# Patient Record
Sex: Male | Born: 1965 | Race: White | Hispanic: No | Marital: Married | Smoking: Never smoker
Health system: Southern US, Community
[De-identification: ages and names within clinical notes are randomized; demographics above are authoritative.]

## PROBLEM LIST (undated history)

## (undated) DIAGNOSIS — Z9889 Other specified postprocedural states: Secondary | ICD-10-CM

## (undated) DIAGNOSIS — C801 Malignant (primary) neoplasm, unspecified: Secondary | ICD-10-CM

## (undated) DIAGNOSIS — T7840XA Allergy, unspecified, initial encounter: Secondary | ICD-10-CM

## (undated) DIAGNOSIS — B019 Varicella without complication: Secondary | ICD-10-CM

## (undated) DIAGNOSIS — T8859XA Other complications of anesthesia, initial encounter: Secondary | ICD-10-CM

## (undated) DIAGNOSIS — N200 Calculus of kidney: Secondary | ICD-10-CM

## (undated) DIAGNOSIS — E785 Hyperlipidemia, unspecified: Secondary | ICD-10-CM

## (undated) DIAGNOSIS — Z87442 Personal history of urinary calculi: Secondary | ICD-10-CM

## (undated) DIAGNOSIS — M1990 Unspecified osteoarthritis, unspecified site: Secondary | ICD-10-CM

## (undated) DIAGNOSIS — J302 Other seasonal allergic rhinitis: Secondary | ICD-10-CM

## (undated) DIAGNOSIS — K759 Inflammatory liver disease, unspecified: Secondary | ICD-10-CM

## (undated) HISTORY — PX: SHOULDER ARTHROSCOPY: SHX128

## (undated) HISTORY — DX: Calculus of kidney: N20.0

## (undated) HISTORY — DX: Other seasonal allergic rhinitis: J30.2

## (undated) HISTORY — DX: Varicella without complication: B01.9

## (undated) HISTORY — DX: Hyperlipidemia, unspecified: E78.5

## (undated) HISTORY — PX: ULNAR NERVE DECOMPRESSION: SHX7404

## (undated) HISTORY — DX: Unspecified osteoarthritis, unspecified site: M19.90

## (undated) HISTORY — DX: Inflammatory liver disease, unspecified: K75.9

## (undated) HISTORY — DX: Allergy, unspecified, initial encounter: T78.40XA

## (undated) HISTORY — DX: Gilbert syndrome: E80.4

## (undated) HISTORY — PX: COLONOSCOPY: SHX174

## (undated) HISTORY — PX: WISDOM TOOTH EXTRACTION: SHX21

---

## 1998-09-18 ENCOUNTER — Ambulatory Visit (HOSPITAL_BASED_OUTPATIENT_CLINIC_OR_DEPARTMENT_OTHER): Admission: RE | Admit: 1998-09-18 | Discharge: 1998-09-18 | Payer: Self-pay | Admitting: Orthopaedic Surgery

## 2009-12-24 ENCOUNTER — Encounter: Admission: RE | Admit: 2009-12-24 | Discharge: 2009-12-24 | Payer: Self-pay | Admitting: Family Medicine

## 2016-12-23 ENCOUNTER — Ambulatory Visit (INDEPENDENT_AMBULATORY_CARE_PROVIDER_SITE_OTHER): Payer: BC Managed Care – PPO | Admitting: Family Medicine

## 2016-12-23 ENCOUNTER — Encounter: Payer: Self-pay | Admitting: Family Medicine

## 2016-12-23 VITALS — BP 102/72 | HR 83 | Temp 98.2°F | Ht 75.0 in | Wt 240.6 lb

## 2016-12-23 DIAGNOSIS — M1990 Unspecified osteoarthritis, unspecified site: Secondary | ICD-10-CM | POA: Insufficient documentation

## 2016-12-23 DIAGNOSIS — Z0000 Encounter for general adult medical examination without abnormal findings: Secondary | ICD-10-CM | POA: Diagnosis not present

## 2016-12-23 DIAGNOSIS — E785 Hyperlipidemia, unspecified: Secondary | ICD-10-CM

## 2016-12-23 DIAGNOSIS — R739 Hyperglycemia, unspecified: Secondary | ICD-10-CM | POA: Insufficient documentation

## 2016-12-23 DIAGNOSIS — N200 Calculus of kidney: Secondary | ICD-10-CM | POA: Insufficient documentation

## 2016-12-23 LAB — COMPREHENSIVE METABOLIC PANEL
ALT: 25 U/L (ref 0–53)
AST: 18 U/L (ref 0–37)
Albumin: 4.6 g/dL (ref 3.5–5.2)
Alkaline Phosphatase: 71 U/L (ref 39–117)
BILIRUBIN TOTAL: 1.4 mg/dL — AB (ref 0.2–1.2)
BUN: 16 mg/dL (ref 6–23)
CO2: 31 meq/L (ref 19–32)
Calcium: 9.6 mg/dL (ref 8.4–10.5)
Chloride: 106 mEq/L (ref 96–112)
Creatinine, Ser: 0.97 mg/dL (ref 0.40–1.50)
GFR: 87.38 mL/min (ref >60.00)
GLUCOSE: 88 mg/dL (ref 70–99)
Potassium: 4.5 mEq/L (ref 3.5–5.1)
Sodium: 141 mEq/L (ref 135–145)
Total Protein: 7 g/dL (ref 6.0–8.3)

## 2016-12-23 LAB — CBC
HCT: 47.4 % (ref 39.0–52.0)
HEMOGLOBIN: 16.3 g/dL (ref 13.0–17.0)
MCHC: 34.5 g/dL (ref 30.0–36.0)
MCV: 88.8 fl (ref 78.0–100.0)
Platelets: 192 10*3/uL (ref 150.0–400.0)
RBC: 5.33 Mil/uL (ref 4.22–5.81)
RDW: 13.2 % (ref 11.5–15.5)
WBC: 6.8 10*3/uL (ref 4.0–10.5)

## 2016-12-23 LAB — LIPID PANEL
CHOL/HDL RATIO: 4
Cholesterol: 207 mg/dL — ABNORMAL HIGH (ref 0–200)
HDL: 49.3 mg/dL (ref >39.00)
LDL Cholesterol: 141 mg/dL — ABNORMAL HIGH (ref 0–99)
NONHDL: 157.74
Triglycerides: 83 mg/dL (ref 0.0–149.0)
VLDL: 16.6 mg/dL (ref 0.0–40.0)

## 2016-12-23 LAB — HEMOGLOBIN A1C: Hgb A1c MFr Bld: 5.3 % (ref 4.6–6.5)

## 2016-12-23 NOTE — Patient Instructions (Addendum)
Will await records since you have already requested or you can send me last Tdap/tetanus shot through [facility] before you leave  Would advise 150 minutes exercise, consider stopping eating after 8. I like your goal of 15-20 lbs down in a years time.

## 2016-12-23 NOTE — Progress Notes (Signed)
Phone: [phone]  Subjective:  Patient presents today to establish care. Prior patient of Dr. Tollie Pizza. Chief complaint-noted.   See problem oriented charting  The following were reviewed and entered/updated in epic: Past Medical History:  Diagnosis Date  . Arthritis    during scans for kidney stones. [facility]- hip bilateral  . Chicken pox   . Hepatitis    junior in [place]. Jaundice mild. was told no further workup needed. ? Hep A. self resolved  . Hyperlipidemia    no rx  . Kidney stone    as needed with urology. 2019 next planned visit.    Patient Active Problem List   Diagnosis Date Noted  . Kidney stone   . Hyperlipidemia   . Arthritis    Past Surgical History:  Procedure Laterality Date  . SHOULDER ARTHROSCOPY Right    rotator cuff. Dr. Latanya Maudlin [facility]    Family History  Problem Relation Age of Onset  . Other Mother     not very open about her health  . Atrial fibrillation Father     has had some vertiog  . Healthy Brother     has had some vertigo, mild anxiety  . Arthritis Maternal Grandmother   . Colon cancer Maternal Grandmother     late life   . Hyperlipidemia Paternal Grandmother   . Heart disease Paternal Grandmother     17s  . Stroke Paternal Grandmother   . Hypertension Paternal Grandmother   . Prostate cancer Paternal Grandfather     lived near a plant  . CAD Maternal Grandfather     "hardening of arteries" "smoked like chimneys"    Medications- reviewed and updated No current outpatient prescriptions on file.   No current facility-administered medications for this visit.     Allergies-reviewed and updated No Known Allergies  Social History   Social History  . Marital status: Single    Spouse name: N/A  . Number of children: N/A  . Years of education: N/A   Social History Main Topics  . Smoking status: Never Smoker  . Smokeless tobacco: Never Used  . Alcohol use Yes     Comment: Social 2 beers a month  . Drug use: No    . Sexual activity: Yes   Other Topics Concern  . None   Social History Narrative   Married 1998. 2 daughters ('03, '06). Hs and middle school in 2018.       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    Undergrad- [employer]   Masters- [employer]      Hobbies: play tennis, hiking, working on Immunologist to play guitar    ROS--Full ROS was completed Review of Systems  Constitutional: Negative for chills and fever.  HENT: Negative for hearing loss and tinnitus.   Eyes: Negative for blurred vision and double vision.  Respiratory: Negative for cough and shortness of breath.   Cardiovascular: Negative for chest pain and palpitations.  Gastrointestinal: Negative for heartburn and nausea.  Genitourinary: Negative for dysuria and urgency.  Musculoskeletal: Positive for joint pain (hip pain at times). Negative for myalgias and neck pain.  Skin: Negative for itching and rash.  Neurological: Negative for dizziness and headaches.  Endo/Heme/Allergies: Negative for polydipsia. Does not bruise/bleed easily.  Psychiatric/Behavioral: Negative for hallucinations and substance abuse.   Objective: BP 102/72 (BP Location: Left Arm, Patient Position: Sitting, Cuff Size: Large)   Pulse 83   Temp 98.2 F (36.8 C) (Oral)  Ht 6\' 3"  (1.905 m)   Wt 240 lb 9.6 oz (109.1 kg)   SpO2 95%   BMI 30.07 kg/m  Gen: NAD, resting comfortably HEENT: Mucous membranes are moist. Oropharynx normal. TM normal. Eyes: sclera and lids normal, PERRLA Neck: no thyromegaly, no cervical lymphadenopathy CV: RRR no murmurs rubs or gallops Lungs: CTAB no crackles, wheeze, rhonchi Abdomen: soft/nontender/nondistended/normal bowel sounds. No rebound or guarding. obese Ext: no edema, 2+ DP pulses Skin: warm, dry Neuro: 5/5 strength in upper and lower extremities, normal gait, normal reflexes GU deferred and rectal deferred to next year  Assessment/Plan:  51 y.o. male presenting for annual  physical.  Health Maintenance counseling: 1. Anticipatory guidance: Patient counseled regarding regular dental exams - q6 months, eye exams -yearly Dr. Satira Sark, wearing seatbelts.  2. Risk factor reduction:  Advised patient of need for regular exercise and diet rich and fruits and vegetables to reduce risk of heart attack and stroke. Exercise- averages about once a week 1.5 hours tennis, walks some on campus. Advised 150 minutes a week. Diet-thinks largely reasonable- sweets are a trouble spot when he compares to his weight. States if he could stop eating 8 o clock at night could be 15 lbs lighter.  Wt Readings from Last 3 Encounters:  12/23/16 240 lb 9.6 oz (109.1 kg)   3. Immunizations/screenings/ancillary studies Immunization History  Administered Date(s) Administered  . Influenza-Unspecified 09/10/2016   Health Maintenance Due  Topic Date Due  . HIV Screening - before marriage had std testing as part of general physical 10/29/1982  . TETANUS/TDAP - around 2017, to get records 10/29/1986   4. Prostate cancer screening- no early family history (mid to late 14s), start at age 2 5. Colon cancer screening - no early  family history (in 66s), start at age 89 6. Skin cancer screening- Dr. Ronnald Ramp [facility] yearly 7. Sleep 6.5 hours- advised 7-8. Gets up to go to restroom and cannot get back to sleep. Feels rested.   Status of chronic or acute concerns  Hyperlipidemia- no rx- calculate 10 year risk  Hyperglycemia- told in past based off sugar- also get a1c- fasting today  Return in about 1 year (around 12/23/2017) for physical.  Orders Placed This Encounter  Procedures  . CBC    [place]  . Comprehensive metabolic panel    [place]    Order Specific Question:   Has the patient fasted?    Answer:   No  . Lipid panel    [place]    Order Specific Question:   Has the patient fasted?    Answer:   No  . Hemoglobin A1c    [place]   Return precautions advised.   Garret Reddish, MD

## 2016-12-23 NOTE — Progress Notes (Signed)
Pre visit review using our clinic review tool, if applicable. No additional management support is needed unless otherwise documented below in the visit note. 

## 2016-12-24 ENCOUNTER — Encounter: Payer: Self-pay | Admitting: Family Medicine

## 2017-10-13 ENCOUNTER — Ambulatory Visit: Payer: BC Managed Care – PPO | Admitting: Podiatry

## 2017-10-13 ENCOUNTER — Ambulatory Visit (INDEPENDENT_AMBULATORY_CARE_PROVIDER_SITE_OTHER): Payer: BC Managed Care – PPO

## 2017-10-13 ENCOUNTER — Encounter: Payer: Self-pay | Admitting: Podiatry

## 2017-10-13 DIAGNOSIS — M779 Enthesopathy, unspecified: Principal | ICD-10-CM

## 2017-10-13 DIAGNOSIS — M778 Other enthesopathies, not elsewhere classified: Secondary | ICD-10-CM

## 2017-10-13 DIAGNOSIS — M7750 Other enthesopathy of unspecified foot: Secondary | ICD-10-CM

## 2017-10-13 DIAGNOSIS — M722 Plantar fascial fibromatosis: Secondary | ICD-10-CM

## 2017-10-13 DIAGNOSIS — R252 Cramp and spasm: Secondary | ICD-10-CM

## 2017-10-13 NOTE — Progress Notes (Signed)
  Subjective:  Patient ID: Jonathan Knox, male    DOB: Apr 16, 1966,  MRN: [id number] HPI Chief Complaint  Patient presents with  . Foot Pain    Plantar forefoot left - callused area x few months, seen 2 dermatologist and treated - no help, been using salycylic acid at home-tender  . Foot Pain    Arch left - patient states he gets cramping in arch periodically    51 y.o. male presents with the above complaint.     Past Medical History:  Diagnosis Date  . Arthritis    during scans for kidney stones. [facility]- hip bilateral  . Chicken pox   . Hepatitis    junior in [place]. Jaundice mild. was told no further workup needed. ? Hep A. self resolved  . Hyperlipidemia    no rx  . Kidney stone    as needed with urology. 2019 next planned visit.    Past Surgical History:  Procedure Laterality Date  . SHOULDER ARTHROSCOPY Right    rotator cuff. Dr. Latanya Maudlin [facility]   No current outpatient medications on file.  No Known Allergies Review of Systems  All other systems reviewed and are negative.  Objective:  There were no vitals filed for this visit.  General: Well developed, nourished, in no acute distress, alert and oriented x3   Dermatological: Skin is warm, dry and supple bilateral. Nails x 10 are well maintained; remaining integument appears unremarkable at this time. There are no open sores, no preulcerative lesions, no rash or signs of infection present. Porokeratosis left foot.  Vascular: Dorsalis Pedis artery and Posterior Tibial artery pedal pulses are 2/4 bilateral with immedate capillary fill time. Pedal hair growth present. No varicosities and no lower extremity edema present bilateral.   Neruologic: Grossly intact via light touch bilateral. Vibratory intact via tuning fork bilateral. Protective threshold with Semmes Wienstein monofilament intact to all pedal sites bilateral. Patellar and Achilles deep tendon reflexes 2+ bilateral. No Babinski or clonus noted  bilateral.   Musculoskeletal: No gross boney pedal deformities bilateral. No pain, crepitus, or limitation noted with foot and ankle range of motion bilateral. Muscular strength 5/5 in all groups tested bilateral.  Gait: Unassisted, Nonantalgic.    Radiographs:  Radiographs taken today do not demonstrate any type of major osseous abnormalities though doesn't straighten midtarsal breech.  Assessment & Plan:   Assessment: Midtarsal breech with a history of myofascial pain and cramps. Porokeratosis plantar aspect forefoot left.  Plan: We discussed getting him a sports orthotic to help reduce the stress on the plantar fascia.     Jonathan Knox T. [place], [place]

## 2017-10-26 ENCOUNTER — Ambulatory Visit (INDEPENDENT_AMBULATORY_CARE_PROVIDER_SITE_OTHER): Payer: BC Managed Care – PPO | Admitting: Orthotics

## 2017-10-26 DIAGNOSIS — M722 Plantar fascial fibromatosis: Secondary | ICD-10-CM

## 2017-10-26 DIAGNOSIS — M7750 Other enthesopathy of unspecified foot: Secondary | ICD-10-CM

## 2017-10-26 DIAGNOSIS — M779 Enthesopathy, unspecified: Secondary | ICD-10-CM

## 2017-10-26 DIAGNOSIS — M778 Other enthesopathies, not elsewhere classified: Secondary | ICD-10-CM

## 2017-10-26 NOTE — Progress Notes (Signed)
Patient came into today to be cast for Custom Foot Orthotics. Upon recommendation of Dr. Milinda Pointer  Patient presents with p/f left as well a keratoma left distal to 5th met head Goals are long arch support, forefoot cushioning, offload keratoma Plan vendor [employer]

## 2017-11-26 ENCOUNTER — Ambulatory Visit (INDEPENDENT_AMBULATORY_CARE_PROVIDER_SITE_OTHER): Payer: BC Managed Care – PPO | Admitting: Orthotics

## 2017-11-26 DIAGNOSIS — M722 Plantar fascial fibromatosis: Secondary | ICD-10-CM | POA: Diagnosis not present

## 2017-11-26 NOTE — Progress Notes (Signed)
Patient came in today to pick up custom made foot orthotics.  The goals were accomplished and the patient reported no dissatisfaction with said orthotics.  Patient was advised of breakin period and how to report any issues. 

## 2017-12-29 ENCOUNTER — Encounter: Payer: Self-pay | Admitting: Internal Medicine

## 2017-12-29 ENCOUNTER — Encounter: Payer: Self-pay | Admitting: Family Medicine

## 2017-12-29 ENCOUNTER — Ambulatory Visit (INDEPENDENT_AMBULATORY_CARE_PROVIDER_SITE_OTHER): Payer: BC Managed Care – PPO | Admitting: Family Medicine

## 2017-12-29 VITALS — BP 98/70 | HR 73 | Temp 98.0°F | Ht 75.0 in | Wt 227.4 lb

## 2017-12-29 DIAGNOSIS — Z1211 Encounter for screening for malignant neoplasm of colon: Secondary | ICD-10-CM

## 2017-12-29 DIAGNOSIS — E785 Hyperlipidemia, unspecified: Secondary | ICD-10-CM

## 2017-12-29 DIAGNOSIS — Z0000 Encounter for general adult medical examination without abnormal findings: Secondary | ICD-10-CM | POA: Diagnosis not present

## 2017-12-29 DIAGNOSIS — Z125 Encounter for screening for malignant neoplasm of prostate: Secondary | ICD-10-CM

## 2017-12-29 LAB — COMPREHENSIVE METABOLIC PANEL
ALT: 20 U/L (ref 0–53)
AST: 18 U/L (ref 0–37)
Albumin: 4.5 g/dL (ref 3.5–5.2)
Alkaline Phosphatase: 78 U/L (ref 39–117)
BUN: 13 mg/dL (ref 6–23)
CALCIUM: 9.5 mg/dL (ref 8.4–10.5)
CHLORIDE: 102 meq/L (ref 96–112)
CO2: 31 mEq/L (ref 19–32)
Creatinine, Ser: 0.9 mg/dL (ref 0.40–1.50)
GFR: 94.87 mL/min (ref >60.00)
Glucose, Bld: 95 mg/dL (ref 70–99)
POTASSIUM: 4.5 meq/L (ref 3.5–5.1)
Sodium: 139 mEq/L (ref 135–145)
TOTAL PROTEIN: 7.5 g/dL (ref 6.0–8.3)
Total Bilirubin: 2 mg/dL — ABNORMAL HIGH (ref 0.2–1.2)

## 2017-12-29 LAB — LIPID PANEL
Cholesterol: 198 mg/dL (ref 0–200)
HDL: 54.2 mg/dL (ref >39.00)
LDL CALC: 127 mg/dL — AB (ref 0–99)
NonHDL: 144.23
Total CHOL/HDL Ratio: 4
Triglycerides: 84 mg/dL (ref 0.0–149.0)
VLDL: 16.8 mg/dL (ref 0.0–40.0)

## 2017-12-29 LAB — CBC
HCT: 48.9 % (ref 39.0–52.0)
HEMOGLOBIN: 16.8 g/dL (ref 13.0–17.0)
MCHC: 34.3 g/dL (ref 30.0–36.0)
MCV: 89.2 fl (ref 78.0–100.0)
PLATELETS: 185 10*3/uL (ref 150.0–400.0)
RBC: 5.48 Mil/uL (ref 4.22–5.81)
RDW: 13.4 % (ref 11.5–15.5)
WBC: 6.8 10*3/uL (ref 4.0–10.5)

## 2017-12-29 NOTE — Assessment & Plan Note (Signed)
Hyperlipidemia- mild elevations. usuing last years lipids- 10 year risk is 2.3% using last years labs  (last year calculation was 2.3%). Update lipids today- hopeful improved with weiht loss Lab Results  Component Value Date   CHOL 207 (H) 12/23/2016   HDL 49.30 12/23/2016   LDLCALC 141 (H) 12/23/2016   TRIG 83.0 12/23/2016   CHOLHDL 4 12/23/2016

## 2017-12-29 NOTE — Patient Instructions (Signed)
Please stop by lab before you go  [place] job on weight loss- I love your goal of 15 lbs more off over the next year or two

## 2017-12-29 NOTE — Progress Notes (Signed)
Phone: [phone]  Subjective:  Patient presents today for their annual physical. Chief complaint-noted.   See problem oriented charting- ROS- full  review of systems was completed and negative except for: seasonal allergies  The following were reviewed and entered/updated in epic: Past Medical History:  Diagnosis Date  . Arthritis    during scans for kidney stones. [facility]- hip bilateral  . Chicken pox   . Hepatitis    junior in [place]. Jaundice mild. was told no further workup needed. ? Hep A. self resolved  . Hyperlipidemia    no rx  . Kidney stone    as needed with urology. 2019 next planned visit.    Patient Active Problem List   Diagnosis Date Noted  . Hyperglycemia 12/23/2016    Priority: Medium  . Hyperlipidemia     Priority: Medium  . Kidney stone     Priority: Low  . Arthritis     Priority: Low  . 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 SHOULDER ARTHROSCOPY Right    rotator cuff. Dr. Latanya Maudlin [facility]    Family History  Problem Relation Age of Onset  . Other Mother        not very open about her health  . Atrial fibrillation Father        has had some vertiog  . Healthy Brother        has had some vertigo, mild anxiety  . Arthritis Maternal Grandmother   . Colon cancer Maternal Grandmother        late life   . Hyperlipidemia Paternal Grandmother   . Heart disease Paternal Grandmother        25s  . Stroke Paternal Grandmother   . Hypertension Paternal Grandmother   . Prostate cancer Paternal Grandfather        lived near a plant  . CAD Maternal Grandfather        "hardening of arteries" "smoked like chimneys"    Medications- reviewed and updated No current outpatient medications on file.   No current facility-administered medications for this visit.     Allergies-reviewed and updated No Known Allergies  Social History   Social History Narrative   Married 1998. 2 daughters ('03, '06).  Hs and middle school in 2018.       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    Undergrad- [employer]   Masters- [employer]      Hobbies: play tennis, hiking, working on Immunologist to play guitar    Objective: BP 98/70 (BP Location: Left Arm, Patient Position: Sitting, Cuff Size: Large)   Pulse 73   Temp 98 F (36.7 C) (Oral)   Ht 6\' 3"  (1.905 m)   Wt 227 lb 6.4 oz (103.1 kg)   SpO2 97%   BMI 28.42 kg/m  Gen: NAD, resting comfortably HEENT: Mucous membranes are moist. Oropharynx normal Neck: no thyromegaly CV: RRR no murmurs rubs or gallops Lungs: CTAB no crackles, wheeze, rhonchi Abdomen: soft/nontender/nondistended/normal bowel sounds. No rebound or guarding.  Ext: no edema Skin: warm, dry Neuro: grossly normal, moves all extremities, PERRLA  Declines rectal- will do with urology  Assessment/Plan:  52 y.o. male presenting for annual physical.  Health Maintenance counseling: 1. Anticipatory guidance: Patient counseled regarding regular dental exams - q6 months, eye exams - sees Dr. Satira Sark yearly, wearing seatbelts.  2. Risk factor reduction:  Advised patient of need for regular exercise and diet rich and  fruits and vegetables to reduce risk of heart attack and stroke. Last year he wanted to cut down on eating after 8 pm as well as working on cutting odwn sweets. Last year was doing 1.5 hours a week of tennis. He is down 13 lbs- found an app and started counting calories- cut this down and seemed to help. Keeping tennis up as well.  He would like to get another 15 lbs off in next year or two Wt Readings from Last 3 Encounters:  12/29/17 227 lb 6.4 oz (103.1 kg)  12/23/16 240 lb 9.6 oz (109.1 kg)  3. Immunizations/screenings/ancillary studies- shingrix availability issues.  Immunization History  Administered Date(s) Administered  . Influenza-Unspecified 09/10/2016, 09/02/2017  . Td 12/05/2015  . Tdap 03/09/2006  4. Prostate cancer screening-  no  early family history (grandfather with prostate cancer but lived near plant- discussed starting today vs at 65 per USPTF guidelines. He has a march appointment with urology for kidney stone follow up and will do PSA/rectal exam. They will also do UA.  5. Colon cancer screening - refer for colonoscopy today 6. Skin cancer screening- Dr. Ronnald Ramp [facility] yearly. advised regular sunscreen use. Denies worrisome, changing, or new skin lesions. Saw them late September.   Status of chronic or acute concerns   Hyperglycemia- has been told in past based off CBGs but a1c looked great last year Lab Results  Component Value Date   HGBA1C 5.3 12/23/2016   Continues see Dr. Orvil Feil for allergies- twice a month allergy shots. Has done well with that.   Hyperlipidemia Hyperlipidemia- mild elevations. usuing last years lipids- 10 year risk is 2.3% using last years labs  (last year calculation was 2.3%). Update lipids today- hopeful improved with weiht loss Lab Results  Component Value Date   CHOL 207 (H) 12/23/2016   HDL 49.30 12/23/2016   LDLCALC 141 (H) 12/23/2016   TRIG 83.0 12/23/2016   CHOLHDL 4 12/23/2016     Gilberts syndrome High bilirubin- will trend bilirubin today with LFTs. Likely gilberts  1 year CPE  Lab/Order associations: Hyperlipidemia, unspecified hyperlipidemia type - Plan: CBC, Comprehensive metabolic panel, Lipid panel  Screening PSA (prostate specific antigen) - Plan: PSA  Screen for colon cancer - Plan: Ambulatory referral to Gastroenterology  Return precautions advised.  Garret Reddish, MD

## 2017-12-29 NOTE — Assessment & Plan Note (Signed)
High bilirubin- will trend bilirubin today with LFTs. Likely gilberts

## 2018-02-15 ENCOUNTER — Ambulatory Visit (AMBULATORY_SURGERY_CENTER): Payer: Self-pay | Admitting: *Deleted

## 2018-02-15 ENCOUNTER — Other Ambulatory Visit: Payer: Self-pay

## 2018-02-15 VITALS — Ht 74.0 in | Wt 231.2 lb

## 2018-02-15 DIAGNOSIS — Z1211 Encounter for screening for malignant neoplasm of colon: Secondary | ICD-10-CM

## 2018-02-15 MED ORDER — SUPREP BOWEL PREP KIT 17.5-3.13-1.6 GM/177ML PO SOLN: 1.0000 | Freq: Once | ORAL | 0 refills | Status: AC

## 2018-02-15 NOTE — Progress Notes (Signed)
Patient denies any allergies to egg or soy products. Patient states nausea with General anesthesia.  Patient denies oxygen use at home and denies diet medications. Pamphlet given for colonoscopy.

## 2018-02-16 ENCOUNTER — Encounter: Payer: Self-pay | Admitting: Internal Medicine

## 2018-03-01 ENCOUNTER — Other Ambulatory Visit: Payer: Self-pay

## 2018-03-01 ENCOUNTER — Encounter: Payer: Self-pay | Admitting: Internal Medicine

## 2018-03-01 ENCOUNTER — Ambulatory Visit (AMBULATORY_SURGERY_CENTER): Payer: BC Managed Care – PPO | Admitting: Internal Medicine

## 2018-03-01 VITALS — BP 119/70 | HR 69 | Temp 99.1°F | Resp 9 | Ht 74.0 in | Wt 231.0 lb

## 2018-03-01 DIAGNOSIS — Z1211 Encounter for screening for malignant neoplasm of colon: Secondary | ICD-10-CM | POA: Diagnosis present

## 2018-03-01 DIAGNOSIS — D122 Benign neoplasm of ascending colon: Secondary | ICD-10-CM | POA: Diagnosis not present

## 2018-03-01 MED ORDER — SODIUM CHLORIDE 0.9 % IV SOLN: 500.0000 mL | Freq: Once | INTRAVENOUS | Status: DC

## 2018-03-01 NOTE — Progress Notes (Signed)
Called to room to assist during endoscopic procedure.  Patient ID and intended procedure confirmed with present staff. Received instructions for my participation in the procedure from the performing physician.  

## 2018-03-01 NOTE — Op Note (Signed)
[facility] Patient Name: Jonathan Knox Procedure Date: 03/01/2018 11:50 AM MRN: [id number] Endoscopist: Docia Chuck. Henrene Pastor , MD Age: 53 Referring MD:  Date of Birth: [birth year] Gender: Male Account #: [id number] Procedure:                Colonoscopy, with cold snare polypectomy x 1 Indications:              Screening for colorectal malignant neoplasm Medicines:                Monitored Anesthesia Care Procedure:                Pre-Anesthesia Assessment:                           - Prior to the procedure, a History and Physical                            was performed, and patient medications and                            allergies were reviewed. The patient's tolerance of                            previous anesthesia was also reviewed. The risks                            and benefits of the procedure and the sedation                            options and risks were discussed with the patient.                            All questions were answered, and informed consent                            was obtained. Prior Anticoagulants: The patient has                            taken no previous anticoagulant or antiplatelet                            agents. ASA Grade Assessment: I - A normal, healthy                            patient. After reviewing the risks and benefits,                            the patient was deemed in satisfactory condition to                            undergo the procedure.                           After obtaining informed consent, the colonoscope  was passed under direct vision. Throughout the                            procedure, the patient's blood pressure, pulse, and                            oxygen saturations were monitored continuously. The                            Colonoscope was introduced through the anus and                            advanced to the the cecum, identified by   appendiceal orifice and ileocecal valve. The                            ileocecal valve, appendiceal orifice, and rectum                            were photographed. The quality of the bowel                            preparation was good. The colonoscopy was performed                            without difficulty. The patient tolerated the                            procedure well. The bowel preparation used was                            SUPREP. Scope In: 11:52:41 AM Scope Out: 12:09:21 PM Scope Withdrawal Time: 0 hours 13 minutes 54 seconds  Total Procedure Duration: 0 hours 16 minutes 40 seconds  Findings:                 A 2 mm polyp was found in the ascending colon. The                            polyp was removed with a cold snare. Resection and                            retrieval were complete.                           A single diverticulum was found in the ascending                            colon.                           Internal hemorrhoids were found during                            retroflexion. The hemorrhoids were small.  The exam was otherwise without abnormality on                            direct and retroflexion views. Complications:            No immediate complications. Estimated blood loss:                            None. Estimated Blood Loss:     Estimated blood loss: none. Impression:               - One 2 mm polyp in the ascending colon, removed                            with a cold snare. Resected and retrieved.                           - Diverticulosis in the ascending colon.                           - Internal hemorrhoids.                           - The examination was otherwise normal on direct                            and retroflexion views. Recommendation:           - Repeat colonoscopy in 5-10 years for surveillance.                           - Patient has a contact number available for                             emergencies. The signs and symptoms of potential                            delayed complications were discussed with the                            patient. Return to normal activities tomorrow.                            Written discharge instructions were provided to the                            patient.                           - Resume previous diet.                           - Continue present medications.                           - Await pathology results. Docia Chuck. Henrene Pastor, MD 03/01/2018 12:14:38 PM This report has been signed electronically.

## 2018-03-01 NOTE — Progress Notes (Signed)
Pt. Reports no change in his medical or surgical history since his pre-visit 02/15/2018.

## 2018-03-01 NOTE — Patient Instructions (Signed)
HANDOUTS GIVEN FOR POLYPS, DIVERTICULOSIS AND HEMORRHOIDS  YOU HAD AN ENDOSCOPIC PROCEDURE TODAY AT THE [facility]:   Refer to the procedure report that was given to you for any specific questions about what was found during the examination.  If the procedure report does not answer your questions, please call your gastroenterologist to clarify.  If you requested that your care partner not be given the details of your procedure findings, then the procedure report has been included in a sealed envelope for you to review at your convenience later.  YOU SHOULD EXPECT: Some feelings of bloating in the abdomen. Passage of more gas than usual.  Walking can help get rid of the air that was put into your GI tract during the procedure and reduce the bloating. If you had a lower endoscopy (such as a colonoscopy or flexible sigmoidoscopy) you may notice spotting of blood in your stool or on the toilet paper. If you underwent a bowel prep for your procedure, you may not have a normal bowel movement for a few days.  Please Note:  You might notice some irritation and congestion in your nose or some drainage.  This is from the oxygen used during your procedure.  There is no need for concern and it should clear up in a day or so.  SYMPTOMS TO REPORT IMMEDIATELY:   Following lower endoscopy (colonoscopy or flexible sigmoidoscopy):  Excessive amounts of blood in the stool  Significant tenderness or worsening of abdominal pains  Swelling of the abdomen that is new, acute  Fever of 100F or higher  For urgent or emergent issues, a gastroenterologist can be reached at any hour by calling [phone].   DIET:  We do recommend a small meal at first, but then you may proceed to your regular diet.  Drink plenty of fluids but you should avoid alcoholic beverages for 24 hours.  ACTIVITY:  You should plan to take it easy for the rest of today and you should NOT DRIVE or use heavy machinery until tomorrow  (because of the sedation medicines used during the test).    FOLLOW UP: Our staff will call the number listed on your records the next business day following your procedure to check on you and address any questions or concerns that you may have regarding the information given to you following your procedure. If we do not reach you, we will leave a message.  However, if you are feeling well and you are not experiencing any problems, there is no need to return our call.  We will assume that you have returned to your regular daily activities without incident.  If any biopsies were taken you will be contacted by phone or by letter within the next 1-3 weeks.  Please call us at [phone] if you have not heard about the biopsies in 3 weeks.    SIGNATURES/CONFIDENTIALITY: You and/or your care partner have signed paperwork which will be entered into your electronic medical record.  These signatures attest to the fact that that the information above on your After Visit Summary has been reviewed and is understood.  Full responsibility of the confidentiality of this discharge information lies with you and/or your care-partner. 

## 2018-03-01 NOTE — Progress Notes (Signed)
Report to PACU, RN, vss, BBS= Clear.  

## 2018-03-02 ENCOUNTER — Telehealth: Payer: Self-pay

## 2018-03-02 NOTE — Telephone Encounter (Signed)
  Follow up Call-  Call back number 03/01/2018  Post procedure Call Back phone  # [phone]  Permission to leave phone message Yes  Some recent data might be hidden     Patient questions:  Do you have a fever, pain , or abdominal swelling? No. Pain Score  0 *  Have you tolerated food without any problems? Yes.    Have you been able to return to your normal activities? Yes.    Do you have any questions about your discharge instructions: Diet   No. Medications  No. Follow up visit  No.  Do you have questions or concerns about your Care? No.  Actions: * If pain score is 4 or above: No action needed, pain <4.  Per Pt, "everything is a ok".  maw

## 2018-03-05 ENCOUNTER — Encounter: Payer: Self-pay | Admitting: Internal Medicine

## 2018-03-06 ENCOUNTER — Encounter: Payer: Self-pay | Admitting: Family Medicine

## 2018-03-06 DIAGNOSIS — Z86010 Personal history of colonic polyps: Secondary | ICD-10-CM | POA: Insufficient documentation

## 2018-03-06 DIAGNOSIS — Z860101 Personal history of adenomatous and serrated colon polyps: Secondary | ICD-10-CM | POA: Insufficient documentation

## 2018-11-10 ENCOUNTER — Other Ambulatory Visit: Payer: Self-pay | Admitting: Orthopedic Surgery

## 2018-11-10 DIAGNOSIS — M533 Sacrococcygeal disorders, not elsewhere classified: Secondary | ICD-10-CM

## 2018-11-23 ENCOUNTER — Ambulatory Visit
Admission: RE | Admit: 2018-11-23 | Discharge: 2018-11-23 | Disposition: A | Discharge status: Home / Self Care | Payer: BC Managed Care – PPO | Source: Ambulatory Visit | Attending: Orthopedic Surgery | Admitting: Orthopedic Surgery

## 2018-11-23 DIAGNOSIS — M533 Sacrococcygeal disorders, not elsewhere classified: Secondary | ICD-10-CM

## 2018-11-23 MED ORDER — METHYLPREDNISOLONE ACETATE 40 MG/ML INJ SUSP (RADIOLOG: 120.0000 mg | Freq: Once | INTRAMUSCULAR | Status: DC

## 2018-12-30 ENCOUNTER — Ambulatory Visit (INDEPENDENT_AMBULATORY_CARE_PROVIDER_SITE_OTHER): Payer: BC Managed Care – PPO | Admitting: Family Medicine

## 2018-12-30 ENCOUNTER — Encounter: Payer: Self-pay | Admitting: Family Medicine

## 2018-12-30 VITALS — BP 100/60 | HR 85 | Temp 98.0°F | Ht 74.0 in | Wt 244.0 lb

## 2018-12-30 DIAGNOSIS — E785 Hyperlipidemia, unspecified: Secondary | ICD-10-CM

## 2018-12-30 DIAGNOSIS — R5383 Other fatigue: Secondary | ICD-10-CM | POA: Diagnosis not present

## 2018-12-30 DIAGNOSIS — R739 Hyperglycemia, unspecified: Secondary | ICD-10-CM | POA: Diagnosis not present

## 2018-12-30 DIAGNOSIS — Z125 Encounter for screening for malignant neoplasm of prostate: Secondary | ICD-10-CM

## 2018-12-30 DIAGNOSIS — Z86010 Personal history of colonic polyps: Secondary | ICD-10-CM

## 2018-12-30 DIAGNOSIS — Z6831 Body mass index (BMI) 31.0-31.9, adult: Secondary | ICD-10-CM

## 2018-12-30 DIAGNOSIS — Z0000 Encounter for general adult medical examination without abnormal findings: Secondary | ICD-10-CM | POA: Diagnosis not present

## 2018-12-30 DIAGNOSIS — R6882 Decreased libido: Secondary | ICD-10-CM

## 2018-12-30 DIAGNOSIS — Z23 Encounter for immunization: Secondary | ICD-10-CM | POA: Diagnosis not present

## 2018-12-30 LAB — LIPID PANEL
Cholesterol: 208 mg/dL — ABNORMAL HIGH (ref 0–200)
HDL: 50.1 mg/dL (ref >39.00)
LDL Cholesterol: 142 mg/dL — ABNORMAL HIGH (ref 0–99)
NonHDL: 157.67
Total CHOL/HDL Ratio: 4
Triglycerides: 79 mg/dL (ref 0.0–149.0)
VLDL: 15.8 mg/dL (ref 0.0–40.0)

## 2018-12-30 LAB — COMPREHENSIVE METABOLIC PANEL
ALT: 24 U/L (ref 0–53)
AST: 13 U/L (ref 0–37)
Albumin: 4.6 g/dL (ref 3.5–5.2)
Alkaline Phosphatase: 74 U/L (ref 39–117)
BUN: 17 mg/dL (ref 6–23)
CO2: 30 mEq/L (ref 19–32)
Calcium: 9.3 mg/dL (ref 8.4–10.5)
Chloride: 102 mEq/L (ref 96–112)
Creatinine, Ser: 0.93 mg/dL (ref 0.40–1.50)
GFR: 85.6 mL/min (ref >60.00)
GLUCOSE: 89 mg/dL (ref 70–99)
Potassium: 4.1 mEq/L (ref 3.5–5.1)
Sodium: 140 mEq/L (ref 135–145)
Total Bilirubin: 1.6 mg/dL — ABNORMAL HIGH (ref 0.2–1.2)
Total Protein: 7 g/dL (ref 6.0–8.3)

## 2018-12-30 LAB — TSH: TSH: 1.88 u[IU]/mL (ref 0.35–4.50)

## 2018-12-30 LAB — CBC
HCT: 47.9 % (ref 39.0–52.0)
Hemoglobin: 16.7 g/dL (ref 13.0–17.0)
MCHC: 34.9 g/dL (ref 30.0–36.0)
MCV: 90.8 fl (ref 78.0–100.0)
Platelets: 185 10*3/uL (ref 150.0–400.0)
RBC: 5.28 Mil/uL (ref 4.22–5.81)
RDW: 13.7 % (ref 11.5–15.5)
WBC: 7 10*3/uL (ref 4.0–10.5)

## 2018-12-30 LAB — HEMOGLOBIN A1C: HEMOGLOBIN A1C: 5.3 % (ref 4.6–6.5)

## 2018-12-30 NOTE — Patient Instructions (Addendum)
Health Maintenance Due  Topic Date Due  . INFLUENZA VACCINE - Done at work 08/31/2018   Shingrix #1 today. Repeat injection in 2-5 months. 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  If you have mychart- we will send your results within 3 business days of [place] receiving them.  If abnormal or we want to clarify a result, we will call or mychart you to make sure you receive the message.  If you have questions or concerns or don't hear within 5-7 days, please send [place] a message or call us.

## 2018-12-30 NOTE — Progress Notes (Signed)
Phone: [phone]   Subjective:  Patient presents today for their annual physical. Chief complaint-noted.   See problem oriented charting- ROS- full  review of systems was completed and negative except for: urinary frequency, muscle aches, seasonal allergies  BMI monitoring- elevated BMI noted: Body mass index is 31.33 kg/m. Encouraged need for healthy eating, regular exercise, weight loss.   BMI Metric Follow Up - 12/30/18 0819      BMI Metric Follow Up-Please document annually   BMI Metric Follow Up  Education provided      The following were reviewed and entered/updated in epic: Past Medical History:  Diagnosis Date  . Arthritis    [facility]- hip bilateral  . Chicken pox   . Gilbert's syndrome   . Hepatitis    junior in [place]. Jaundice mild. was told no further workup needed. ? Hep A. self resolved  . Hyperlipidemia    no med-diet controlled  . Kidney stone    x 2 - passed stone, no surgery required  . Seasonal allergies    Patient Active Problem List   Diagnosis Date Noted  . History of adenomatous polyp of colon 03/06/2018    Priority: Medium  . Hyperglycemia 12/23/2016    Priority: Medium  . Hyperlipidemia     Priority: Medium  . Gilberts syndrome 12/24/2016    Priority: Low  . Kidney stone     Priority: Low  . Arthritis     Priority: Low   Past Surgical History:  Procedure Laterality Date  . SHOULDER ARTHROSCOPY Right    rotator cuff. Dr. Latanya Maudlin [facility]  . WISDOM TOOTH EXTRACTION      Family History  Problem Relation Age of Onset  . Other Mother        not very open about her health  . Atrial fibrillation Father        has had some vertiog  . Healthy Brother        has had some vertigo, mild anxiety  . Arthritis Maternal Grandmother   . Colon cancer Maternal Grandmother        late life   . Hyperlipidemia Paternal Grandmother   . Heart disease Paternal Grandmother        78s  . Stroke Paternal Grandmother   . Hypertension Paternal  Grandmother   . Prostate cancer Paternal Grandfather        lived near a plant  . CAD Maternal Grandfather        "hardening of arteries" "smoked like chimneys"  . Rectal cancer Neg Hx   . Stomach cancer Neg Hx     Medications- reviewed and updated Current Outpatient Medications  Medication Sig Dispense Refill  . ibuprofen (ADVIL,MOTRIN) 200 MG tablet Take 400 mg by mouth every 6 (six) hours as needed for fever or moderate pain.    Marland Kitchen UNABLE TO FIND Med Name: Allergy Shots 3-4 per month at [facility].    Marland Kitchen EPINEPHrine 0.3 mg/0.3 mL IJ SOAJ injection     . HYDROcodone-acetaminophen (NORCO/VICODIN) 5-325 MG tablet Patient states he has this on hand if needed     No current facility-administered medications for this visit.     Allergies-reviewed and updated No Known Allergies  Social History   Social History Narrative   Married 1998. 2 daughters ('03, '06). Hs and middle school in 2018.       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    [employer]   Masters-  [employer]      Hobbies: play tennis, hiking, working on learning to play guitar   Objective  Objective:  BP 100/60 (BP Location: Right Arm, Patient Position: Sitting, Cuff Size: Large)   Pulse 85   Temp 98 F (36.7 C) (Oral)   Ht 6\' 2"  (1.88 m)   Wt 244 lb (110.7 kg)   SpO2 97%   BMI 31.33 kg/m  Gen: NAD, resting comfortably HEENT: Mucous membranes are moist. Oropharynx normal Neck: no thyromegaly CV: RRR no murmurs rubs or gallops Lungs: CTAB no crackles, wheeze, rhonchi Abdomen: soft/nontender/nondistended/normal bowel sounds. No rebound or guarding.  Ext: no edema Skin: warm, dry Neuro: grossly normal, moves all extremities, PERRLA   Assessment and Plan  53 y.o. male presenting for annual physical.  Health Maintenance counseling: 1. Anticipatory guidance: Patient counseled regarding regular dental exams -q6 months, eye exams -yearly with Dr. Satira Sark,  avoiding  smoking and second hand smoke , limiting alcohol to 2 beverages per day - less than 1.   2. Risk factor reduction:  Advised patient of need for regular exercise and diet rich and fruits and vegetables to reduce risk of heart attack and stroke. Exercise-last year was playing tennis at least an hour and a half a week- per week gets 30 minutes. . Diet-last year found success with a calorie counting- using NOOM app on phone- feels does well between 7 AM and 8 PM- stress response is to eat at nighttime.  Patient was at 227 last physical down from 240 in 2018-weight swung back up- his goal is to get down to low 230s here or 229 on home scale with longer term goal 208 or so.  Wt Readings from Last 3 Encounters:  12/30/18 244 lb (110.7 kg)  03/01/18 231 lb (104.8 kg)  02/15/18 231 lb 3.2 oz (104.9 kg)  3. Immunizations/screenings/ancillary studies-flu shot at work.  Shingrix shot discussed and will do today  Immunization History  Administered Date(s) Administered  . Influenza-Unspecified 09/10/2016, 09/02/2017  . Td 12/05/2015  . Tdap 03/09/2006  4. Prostate cancer screening- patient with a grandfather with prostate cancer but he lives near a plant and it was thought to be related to that.  Last year we discussed starting prostate cancer screening versus deferring at 56-he wanted to discuss with urology at his visit in March 2019- saw Dr. Gilford Rile and had low PSA at around 0.2.  Will do another round in 6 weeks  5. Colon cancer screening - 03/01/2018 with 5-year follow-up due to adenomatous colon polyp history 6. Skin cancer screening-sees Dr. Ronnald Ramp of [facility] dermatology yearly.  Advised regular sunscreen use. Denies worrisome, changing, or new skin lesions.   7.  Never smoker  Status of chronic or acute concerns   Follows with Dr. Remus Blake for allergies-twice a month shots  Low energy/low libido- wants to check testosterone. Fasting between 8-9 AM.  - wants tsh checked as well  Hyperglycemia-based  off prior CBGs but A1c looks great in 2018-we will repeat with recent weight gain Lab Results  Component Value Date   HGBA1C 5.3 12/23/2016    Hyperlipidemia-mild elevations but ten-year risk is been under 5%.  We will update lipids today  Gilbert's syndrome- high bilirubin per baseline-we will trend  No problem-specific Assessment & Plan notes found for this encounter.  No follow-ups on file.  Lab/Order associations:Fasting. Preventative health care - Plan: CBC, Comprehensive metabolic panel, Lipid panel, Testos,Total,Free and SHBG (Male)  Hyperlipidemia, unspecified hyperlipidemia type - Plan: CBC, Comprehensive metabolic  panel, Lipid panel  Gilberts syndrome - Plan: Comprehensive metabolic panel  History of adenomatous polyp of colon  Hyperglycemia - Plan: Hemoglobin A1c  Screening for prostate cancer - Plan: CANCELED: Urinalysis, CANCELED: PSA  Need for shingles vaccine - Plan: Varicella-zoster vaccine IM  BMI 31.0-31.9,adult  Low libido - Plan: Testos,Total,Free and SHBG (Male)  Fatigue, unspecified type - Plan: TSH  Return precautions advised.  Garret Reddish, MD

## 2019-01-02 LAB — TESTOS,TOTAL,FREE AND SHBG (FEMALE)
Free Testosterone: 58.8 pg/mL (ref 35.0–155.0)
Sex Hormone Binding: 32 nmol/L (ref 10–50)
Testosterone, Total, LC-MS-MS: 411 ng/dL (ref 250–1100)

## 2019-01-07 ENCOUNTER — Telehealth: Payer: Self-pay

## 2019-01-07 NOTE — Telephone Encounter (Signed)
   Copied from [place] [id number]. Topic: General - Other >> Jan 07, 2019 11:29 AM Jonathan Knox R wrote: Patient is calling in to schedule 2nd Shingrix vaccine   Pt scheduled! No further action needed.

## 2019-04-12 ENCOUNTER — Other Ambulatory Visit: Payer: Self-pay

## 2019-04-12 ENCOUNTER — Ambulatory Visit (INDEPENDENT_AMBULATORY_CARE_PROVIDER_SITE_OTHER): Payer: BC Managed Care – PPO | Admitting: Family Medicine

## 2019-04-12 ENCOUNTER — Ambulatory Visit: Payer: BC Managed Care – PPO

## 2019-04-12 DIAGNOSIS — Z23 Encounter for immunization: Secondary | ICD-10-CM

## 2019-04-12 NOTE — Patient Instructions (Signed)
There are no preventive care reminders to display for this patient.  Depression screen [facility] 2/9 12/30/2018 12/29/2017  Decreased Interest 0 0  Down, Depressed, Hopeless 0 0  PHQ - 2 Score 0 0

## 2019-04-12 NOTE — Progress Notes (Signed)
Immunization History  Administered Date(s) Administered  . Influenza-Unspecified 09/10/2016, 09/02/2017, 08/31/2018  . Td 12/05/2015  . Tdap 03/09/2006  . Zoster Recombinat (Shingrix) 12/30/2018, 04/12/2019   Shingrix given- staff to document  Garret Reddish

## 2019-07-05 ENCOUNTER — Other Ambulatory Visit: Payer: Self-pay

## 2019-07-05 DIAGNOSIS — Z20822 Contact with and (suspected) exposure to covid-19: Secondary | ICD-10-CM

## 2019-07-06 ENCOUNTER — Telehealth: Payer: Self-pay

## 2019-07-06 LAB — NOVEL CORONAVIRUS, NAA: SARS-CoV-2, NAA: NOT DETECTED

## 2019-07-06 IMAGING — CT CT BIOPSY
4 of 8 series · 12 of 32 positions shown, 17 images · non-contrast
Comparison: none

CLINICAL DATA: Right sacroiliac pain.

[Series 4: needle -guided injection · axial · 0.82mm/px · z∈[-134,-34]mm · 6 of 70 slices shown, 11 images (1 of 4)]
[im 10/70  soft-tissue]
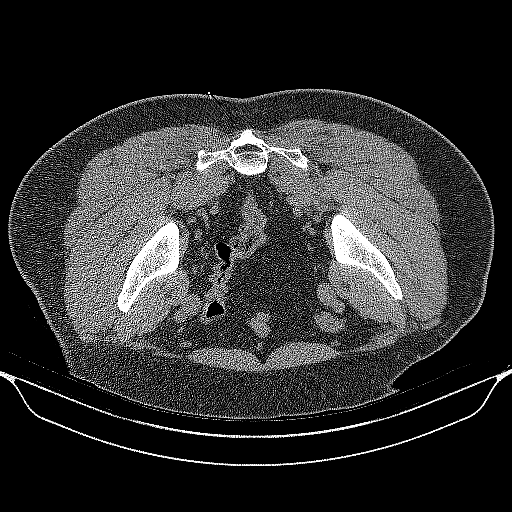
[im 10/70  bone]
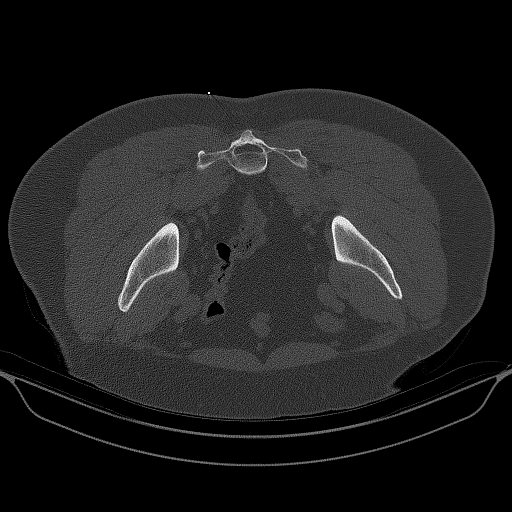
[im 20/70  soft-tissue]
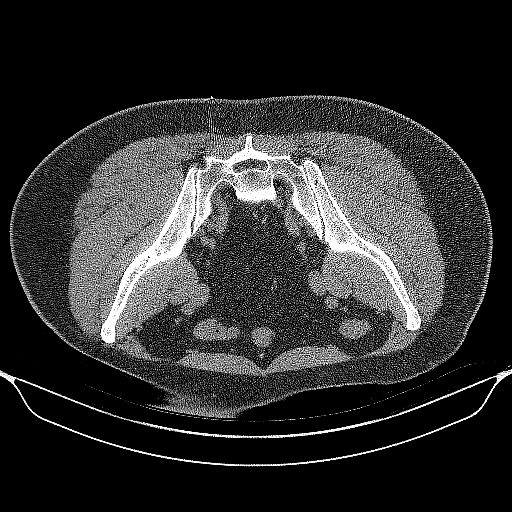
[im 30/70  soft-tissue]
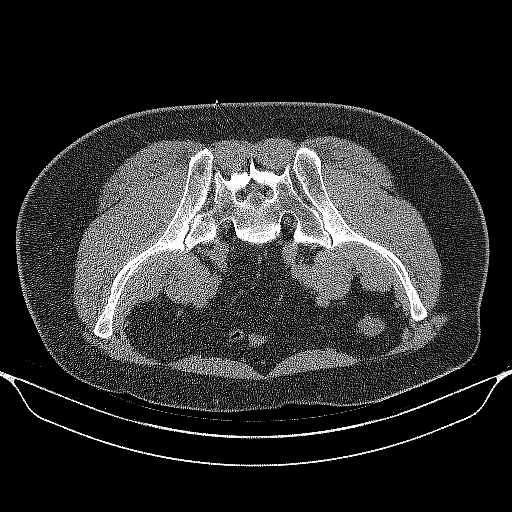
[im 30/70  lung]
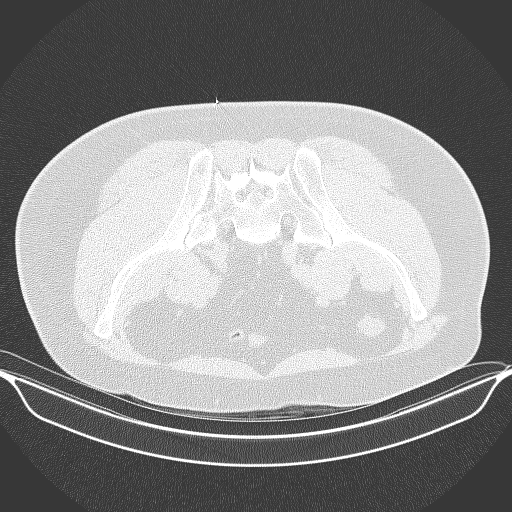
[im 40/70  soft-tissue]
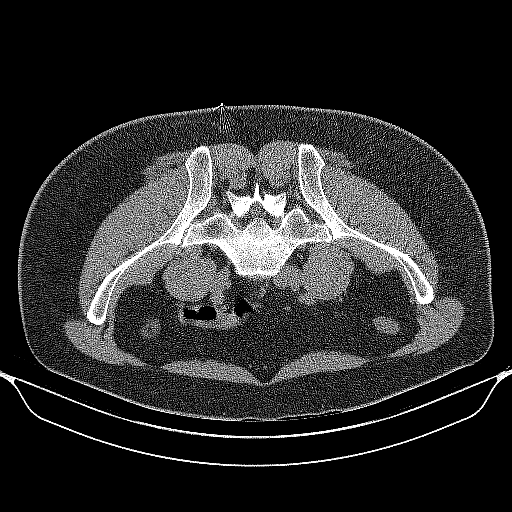
[im 40/70  lung]
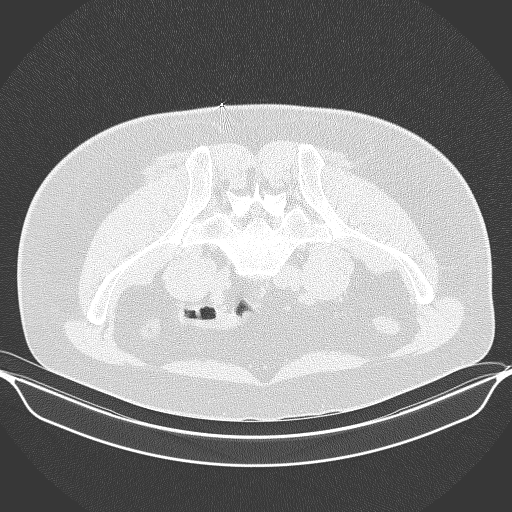
[im 50/70  soft-tissue]
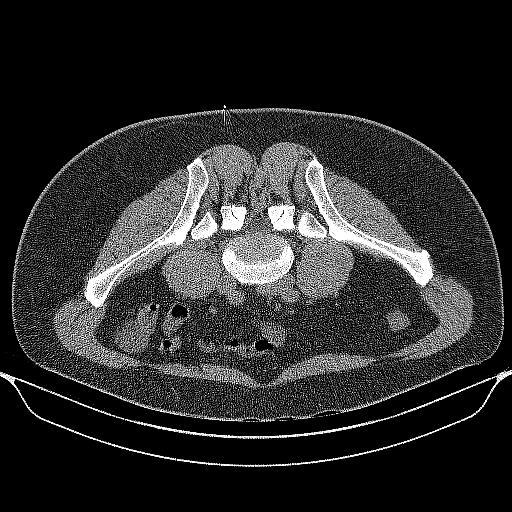
[im 50/70  lung]
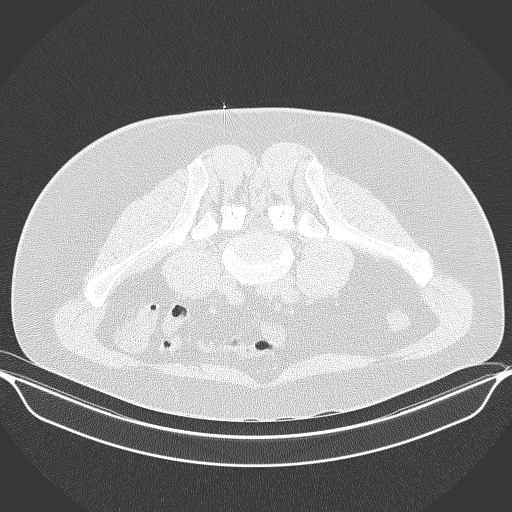
[im 60/70  soft-tissue]
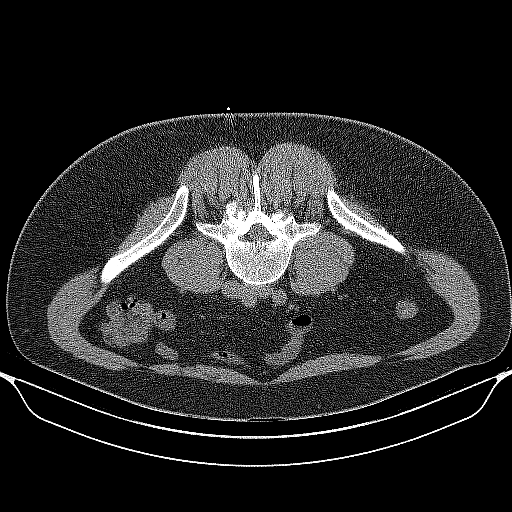
[im 60/70  lung]
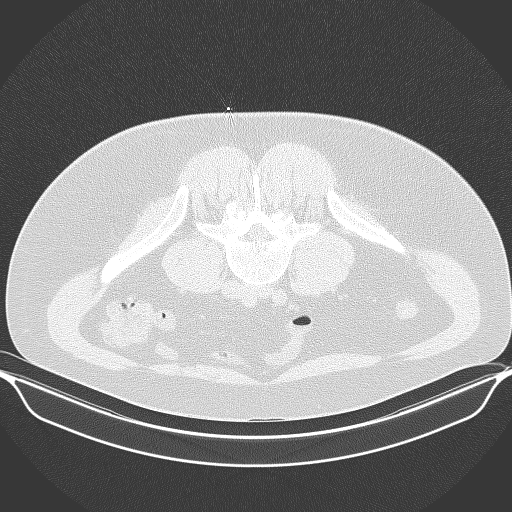

[Series 10: needle -guided injection · axial · 0.78mm/px · z∈[-127,-103]mm · 2 of 36 slices shown (2 of 4)]
[im 12/36  soft-tissue]
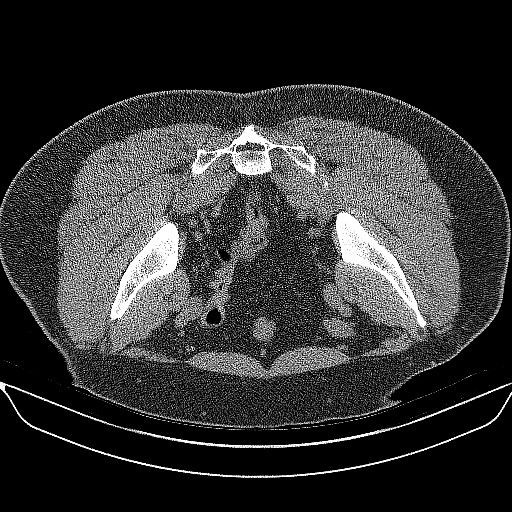
[im 24/36  soft-tissue]
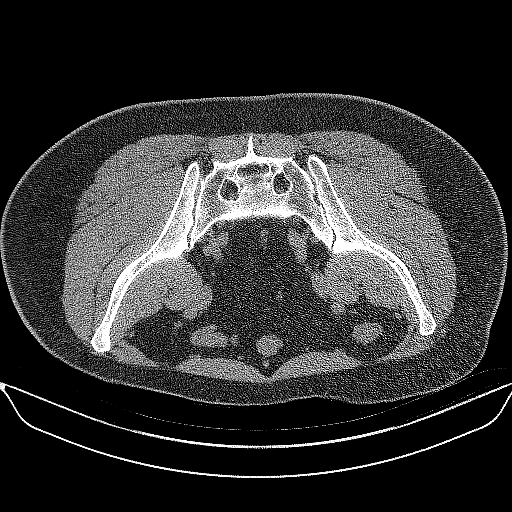

[Series 11: needle -guided injection · axial · 0.78mm/px · z∈[-127,-103]mm · 2 of 36 slices shown (3 of 4)]
[im 12/36  soft-tissue]
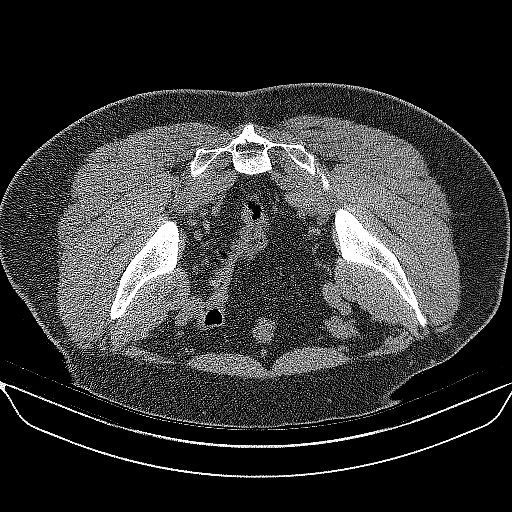
[im 24/36  soft-tissue]
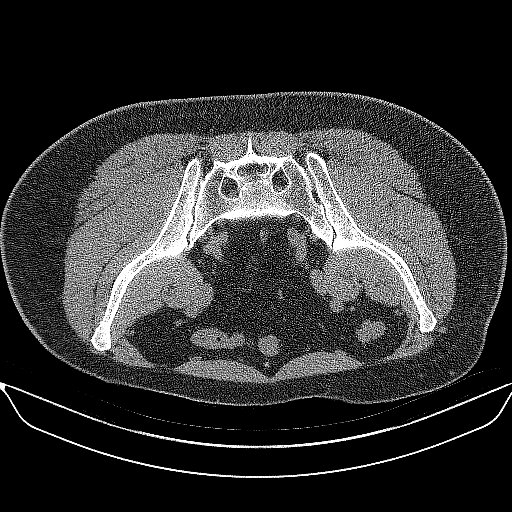

[Series 12: needle -guided injection · axial · 0.78mm/px · z∈[-127,-103]mm · 2 of 36 slices shown (4 of 4)]
[im 12/36  soft-tissue]
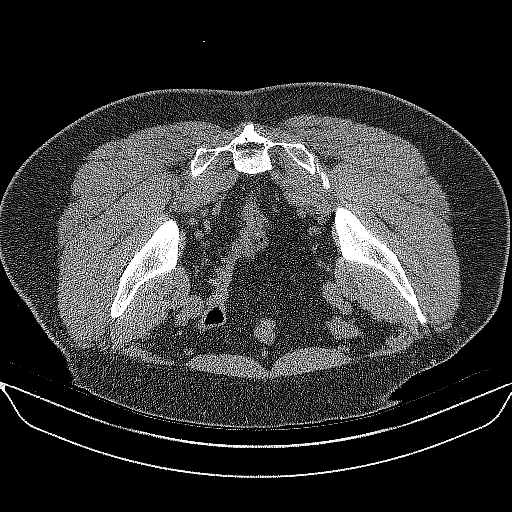
[im 24/36  soft-tissue]
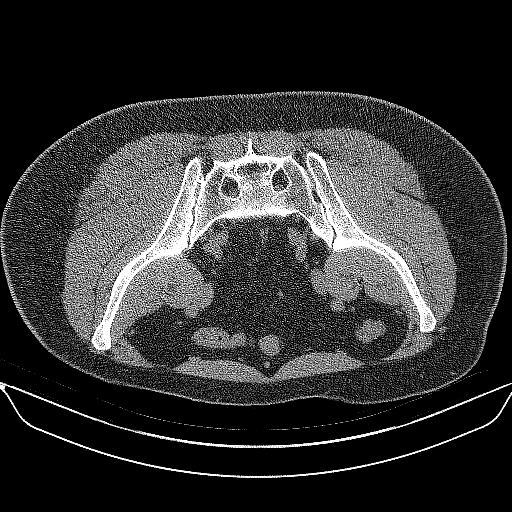

[12 of 32 positions shown; findings below may reference images not displayed]

EXAM:
CT GUIDED RIGHT SI JOINT INJECTION



After local anesthesia with 1% lidocaine without epinephrine and
subsequent deep anesthesia, a 3.5 inch 22 gauge spinal needle was
advanced into the right SI joint under intermittent CT guidance.

Injection of 0.5 mL of Isovue M 200 demonstrated intra-articular
contrast spread as well as some contrast extravasation anterior to
the joint. Subsequently, 120 mg of Depo-Medrol and 1 mL of 0.5%
bupivacaine were injected into the right SI joint. The needle was
removed and a sterile dressing applied.

No complications were observed.
IMPRESSION: Successful CT-guided right SI joint injection.

## 2019-07-15 ENCOUNTER — Other Ambulatory Visit: Payer: Self-pay

## 2019-07-15 DIAGNOSIS — Z20822 Contact with and (suspected) exposure to covid-19: Secondary | ICD-10-CM

## 2019-07-16 LAB — NOVEL CORONAVIRUS, NAA: SARS-CoV-2, NAA: NOT DETECTED

## 2019-07-18 ENCOUNTER — Encounter: Payer: Self-pay | Admitting: Family Medicine

## 2019-07-19 ENCOUNTER — Encounter: Payer: Self-pay | Admitting: Physician Assistant

## 2019-07-19 ENCOUNTER — Ambulatory Visit (INDEPENDENT_AMBULATORY_CARE_PROVIDER_SITE_OTHER): Payer: BC Managed Care – PPO | Admitting: Physician Assistant

## 2019-07-19 VITALS — Temp 98.1°F | Ht 74.0 in | Wt 238.0 lb

## 2019-07-19 DIAGNOSIS — Z7189 Other specified counseling: Secondary | ICD-10-CM | POA: Diagnosis not present

## 2019-07-19 NOTE — Progress Notes (Signed)
Virtual Visit via Video   I connected with Jonathan Knox on 07/19/19 at  3:40 PM EDT by a video enabled telemedicine application and verified that I am speaking with the correct person using two identifiers. Location patient: Home Location provider: [facility], Office Persons participating in the virtual visit: Jonathan, Knox [employer].  I discussed the limitations of evaluation and management by telemedicine and the availability of in person appointments. The patient expressed understanding and agreed to proceed.  I acted as a Education administrator for Jonathan [employer], PA-C [employer], LPN  Subjective:   HPI:   Pt is needing return to work note for his employer. Pt has been tested x 2 for COVID-19 on 8/11 & 8/21 and both have been negative. He had been exposed to his wife who tested postive on 8/11. Pt quarantined himself for 14 days and is asymptomatic.   Has checked his temperature multiple times a day and has never ran a temperature.  ROS: See pertinent positives and negatives per HPI.  Patient Active Problem List   Diagnosis Date Noted  . History of adenomatous polyp of colon 03/06/2018  . Gilberts syndrome 12/24/2016  . Hyperglycemia 12/23/2016  . Kidney stone   . Hyperlipidemia   . Arthritis     Social History   Tobacco Use  . Smoking status: Never Smoker  . Smokeless tobacco: Never Used  Substance Use Topics  . Alcohol use: Yes    Comment: Social 2 beers/wine a month    Current Outpatient Medications:  .  EPINEPHrine 0.3 mg/0.3 mL IJ SOAJ injection, , Disp: , Rfl:  .  ibuprofen (ADVIL,MOTRIN) 200 MG tablet, Take 400 mg by mouth every 6 (six) hours as needed for fever or moderate pain., Disp: , Rfl:  .  UNABLE TO FIND, Med Name: Allergy Shots 3-4 per month at Jonathan Knox., Disp: , Rfl:   No Known Allergies  Objective:   VITALS: Per patient if applicable, see vitals. GENERAL: Alert, appears well and in no acute distress. HEENT: Atraumatic,  conjunctiva clear, no obvious abnormalities on inspection of external nose and ears. NECK: Normal movements of the head and neck. CARDIOPULMONARY: No increased WOB. Speaking in clear sentences. I:E ratio WNL.  MS: Moves all visible extremities without noticeable abnormality. PSYCH: Pleasant and cooperative, well-groomed. Speech normal rate and rhythm. Affect is appropriate. Insight and judgement are appropriate. Attention is focused, linear, and appropriate.  NEURO: CN grossly intact. Oriented as arrived to appointment on time with no prompting. Moves both UE equally.  SKIN: No obvious lesions, wounds, erythema, or cyanosis noted on face or hands.  Assessment and Plan:   [name] was seen today for return to work note.  Diagnoses and all orders for this visit:  Advice Given About Covid-19 Virus Infection   He has remain symptom free and tested negative on two occasions. Released patient to return to work effective tomorrow. Follow-up for any concerns or questions.  . Reviewed expectations re: course of current medical issues. . Discussed self-management of symptoms. . Outlined signs and symptoms indicating need for more acute intervention. . Patient verbalized understanding and all questions were answered. Marland Kitchen Health Maintenance issues including appropriate healthy diet, exercise, and smoking avoidance were discussed with patient. . See orders for this visit as documented in the electronic medical record.  I discussed the assessment and treatment plan with the patient. The patient was provided an opportunity to ask questions and all were answered. The patient agreed with the plan  and demonstrated an understanding of the instructions.   The patient was advised to call back or seek an in-person evaluation if the symptoms worsen or if the condition fails to improve as anticipated.   CMA or LPN served as scribe during this visit. History, Physical, and Plan performed by medical provider. The  above documentation has been reviewed and is accurate and complete.   Jonathan Knox, [place] 07/19/2019

## 2019-08-31 NOTE — Telephone Encounter (Signed)
Erroneous entry

## 2019-10-09 ENCOUNTER — Other Ambulatory Visit: Payer: Self-pay

## 2019-10-09 DIAGNOSIS — Z20822 Contact with and (suspected) exposure to covid-19: Secondary | ICD-10-CM

## 2019-10-10 LAB — NOVEL CORONAVIRUS, NAA: SARS-CoV-2, NAA: NOT DETECTED

## 2020-01-04 ENCOUNTER — Other Ambulatory Visit: Payer: Self-pay

## 2020-01-04 NOTE — Progress Notes (Signed)
Phone: [phone]   Subjective:  Patient presents today for their annual physical. Chief complaint-noted.   See problem oriented charting- Review of Systems  Constitutional: Negative for chills and fever.  HENT: Negative for ear pain, hearing loss and tinnitus.   Eyes: Negative for blurred vision, double vision and photophobia.  Respiratory: Negative for cough, shortness of breath and wheezing.   Cardiovascular: Negative for chest pain, palpitations and leg swelling.  Gastrointestinal: Negative for blood in stool, constipation, diarrhea, heartburn, nausea and vomiting.  Genitourinary: Positive for frequency. Negative for dysuria and urgency.       Followed by urology   Musculoskeletal: Positive for joint pain. Negative for back pain and neck pain.       Right shoulder followed by ortho has surgery coming up  Skin: Negative for itching and rash.  Neurological: Negative for dizziness, seizures, weakness and headaches.  Endo/Heme/Allergies: Negative for environmental allergies. Does not bruise/bleed easily.  Psychiatric/Behavioral: Negative for depression, hallucinations, memory loss, substance abuse and suicidal ideas. The patient is not nervous/anxious and does not have insomnia.     The following were reviewed and entered/updated in epic: Past Medical History:  Diagnosis Date  . Arthritis    [facility]- hip bilateral  . Chicken pox   . Gilbert's syndrome   . Hepatitis    junior in [place]. Jaundice mild. was told no further workup needed. ? Hep A. self resolved  . Hyperlipidemia    no med-diet controlled  . Kidney stone    x 2 - passed stone, no surgery required  . Seasonal allergies    Patient Active Problem List   Diagnosis Date Noted  . History of adenomatous polyp of colon 03/06/2018    Priority: Medium  . Hyperglycemia 12/23/2016    Priority: Medium  . Hyperlipidemia     Priority: Medium  . Gilberts syndrome 12/24/2016    Priority: Low  . Kidney stone      Priority: Low  . Arthritis     Priority: Low   Past Surgical History:  Procedure Laterality Date  . SHOULDER ARTHROSCOPY Right    rotator cuff. Dr. Latanya Maudlin [facility]  . WISDOM TOOTH EXTRACTION      Family History  Problem Relation Age of Onset  . Other Mother        not very open about her health  . Atrial fibrillation Father        has had some vertiog  . Healthy Brother        has had some vertigo, mild anxiety  . Arthritis Maternal Grandmother   . Colon cancer Maternal Grandmother        late life   . Hyperlipidemia Paternal Grandmother   . Heart disease Paternal Grandmother        21s  . Stroke Paternal Grandmother   . Hypertension Paternal Grandmother   . Prostate cancer Paternal Grandfather        lived near a plant  . CAD Maternal Grandfather        "hardening of arteries" "smoked like chimneys"  . Rectal cancer Neg Hx   . Stomach cancer Neg Hx     Medications- reviewed and updated Current Outpatient Medications  Medication Sig Dispense Refill  . EPINEPHrine 0.3 mg/0.3 mL IJ SOAJ injection     . ibuprofen (ADVIL,MOTRIN) 200 MG tablet Take 400 mg by mouth every 6 (six) hours as needed for fever or moderate pain.    Marland Kitchen UNABLE TO FIND Med Name: Allergy Shots  3-4 per [facility] at [facility].     No current facility-administered medications for this visit.    Allergies-reviewed and updated No Known Allergies  Social History   Social History Narrative   Married 1998. 2 daughters ('03, '06). Senior and freshman 12/2019. Oldest going to [employer].       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    Undergrad- [employer]   Masters- [employer]      Hobbies: play tennis, hiking, working on Immunologist to play guitar   Objective  Objective:  BP 102/62   Pulse 89   Temp 97.6 F (36.4 C) (Temporal)   Ht 6\' 3"  (1.905 m)   Wt 232 lb (105.2 kg)   SpO2 96%   BMI 29.00 kg/m  Gen: NAD, resting comfortably HEENT: Mask not removed due to covid 19.  TM normal. Bridge of nose normal. Eyelids normal.  Neck: no thyromegaly or cervical lymphadenopathy  CV: RRR no murmurs rubs or gallops Lungs: CTAB no crackles, wheeze, rhonchi Abdomen: soft/nontender/nondistended/normal bowel sounds. No rebound or guarding.  Ext: no edema Skin: warm, dry Neuro: grossly normal, moves all extremities, PERRLA   Assessment and Plan  54 y.o. male presenting for annual physical.  Health Maintenance counseling: 1. Anticipatory guidance: Patient counseled regarding regular dental exams q6 months, eye exams yearly with Dr. Satira Sark,  avoiding smoking and second hand smoke , limiting alcohol to 2 beverages per day . Does drink 2-3 beers a month.    2. Risk factor reduction:  Advised patient of need for regular exercise and diet rich and fruits and vegetables to reduce risk of heart attack and stroke. Exercise- walks dog about 1 mile daily and walks a lot at work when there on college campus. Diet-working on healthy eating and portion control .  Patient's long-term goal on home scales has been 74 by age 53.  He is down about 6 labs from last visit and from peak down 12. Has been as low as 227 here in recent years. He has found Noom helpful.  Wt Readings from Last 3 Encounters:  01/05/20 232 lb (105.2 kg)  07/19/19 238 lb (108 kg)  12/30/18 244 lb (110.7 kg)  3. Immunizations/screenings/ancillary studies-usually gets flu shot at work. He is interested in covid 19 vaccine - he may be in phase 3 but is not sure.  Immunization History  Administered Date(s) Administered  . Influenza-Unspecified 09/10/2016, 09/02/2017, 08/31/2018, 09/05/2019  . Td 12/05/2015  . Tdap 03/09/2006  . Zoster Recombinat (Shingrix) 12/30/2018, 04/12/2019  4. Prostate cancer screening- patient with grandfather with prostate cancer but he lives near [place] and was thought related to that.  Patient follows with Dr. Gilford Rile who did a PSA last year which was rather low.  Today, patient states he sees Dr.  Gilford Rile again in late April- defer PSA until then   5. Colon cancer screening -March 01, 2018 with 5-year follow-up due to adenomatous colon polyp history 6. Skin cancer screening-follows with Dr. Ronnald Ramp agrees with dermatology.advised regular sunscreen use. Denies worrisome, changing, or new skin lesions.  7.  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 Getting a lot of family time with kids senior and freeshman in hs- which they have really enjoyed.   Hyperlipidemia, unspecified hyperlipidemia type - Plan: CBC with Differential/Platelet, Comprehensive metabolic panel, Lipid panel -Mild elevations in the past.  10-year ASCVD risk using  last years lipid levels at 3% today.  We will update lipid level and recalculate risk   Hyperglycemia - Plan: Hemoglobin A1c-has had high CBGs in the past but A1c's have looks great-we will repeat A1c today- has lost some weight so hopeful #s remain low  Lab Results  Component Value Date   HGBA1C 5.3 12/30/2018   HGBA1C 5.3 12/23/2016   Gilberts syndrome-high bilirubin at baseline.  Trend LFTs.  Allergies-follows with Dr. Remus Blake and still on twice a month shots.  Has EpiPen available if needed due to allergy shots .  Last year complained of some low energy/low libido-testosterone and thyroid were normal. Still able to get erections.  No worsening symptoms since that time  Upcoming right shoulder surgery with Dr. Latanya Maudlin [facility]. Full thickness tear 20 years ago- having arthroscopic surgery for bone spur and possible cyst removal. He is able to complete 4 mets of activity without chest pain or shortness of breath- no further cardiac workup required- we can fill out form for surgical clearance if sent  Recommended follow up: Return in about 1 year (around 01/04/2021) for physical or sooner if needed.  Lab/Order associations: fasting   ICD-10-CM   1. Preventative health  care  Z00.00 CBC with Differential/Platelet    Comprehensive metabolic panel    Lipid panel    Hemoglobin A1c    Testos,Total,Free and SHBG (Male)  2. Hyperlipidemia, unspecified hyperlipidemia type  E78.5 CBC with Differential/Platelet    Comprehensive metabolic panel    Lipid panel  3. History of adenomatous polyp of colon  Z86.010   4. Hyperglycemia  R73.9 Hemoglobin A1c  5. Gilberts syndrome  E80.4   6. Low libido  R68.82 Testos,Total,Free and SHBG (Male)  7. Kidney stone  N20.0     No orders of the defined types were placed in this encounter.   Return precautions advised.  Garret Reddish, MD

## 2020-01-05 ENCOUNTER — Encounter: Payer: Self-pay | Admitting: Family Medicine

## 2020-01-05 ENCOUNTER — Ambulatory Visit (INDEPENDENT_AMBULATORY_CARE_PROVIDER_SITE_OTHER): Payer: BC Managed Care – PPO | Admitting: Family Medicine

## 2020-01-05 VITALS — BP 102/62 | HR 89 | Temp 97.6°F | Ht 75.0 in | Wt 232.0 lb

## 2020-01-05 DIAGNOSIS — Z86010 Personal history of colonic polyps: Secondary | ICD-10-CM

## 2020-01-05 DIAGNOSIS — E785 Hyperlipidemia, unspecified: Secondary | ICD-10-CM

## 2020-01-05 DIAGNOSIS — N200 Calculus of kidney: Secondary | ICD-10-CM

## 2020-01-05 DIAGNOSIS — R6882 Decreased libido: Secondary | ICD-10-CM

## 2020-01-05 DIAGNOSIS — Z0000 Encounter for general adult medical examination without abnormal findings: Secondary | ICD-10-CM | POA: Diagnosis not present

## 2020-01-05 DIAGNOSIS — R739 Hyperglycemia, unspecified: Secondary | ICD-10-CM | POA: Diagnosis not present

## 2020-01-05 LAB — CBC WITH DIFFERENTIAL/PLATELET
Basophils Absolute: 0 10*3/uL (ref 0.0–0.1)
Basophils Relative: 0.3 % (ref 0.0–3.0)
Eosinophils Absolute: 0.3 10*3/uL (ref 0.0–0.7)
Eosinophils Relative: 4.1 % (ref 0.0–5.0)
HCT: 47.2 % (ref 39.0–52.0)
Hemoglobin: 16.5 g/dL (ref 13.0–17.0)
Lymphocytes Relative: 25.4 % (ref 12.0–46.0)
Lymphs Abs: 2 10*3/uL (ref 0.7–4.0)
MCHC: 34.9 g/dL (ref 30.0–36.0)
MCV: 88.9 fl (ref 78.0–100.0)
Monocytes Absolute: 0.6 10*3/uL (ref 0.1–1.0)
Monocytes Relative: 7.2 % (ref 3.0–12.0)
Neutro Abs: 5.1 10*3/uL (ref 1.4–7.7)
Neutrophils Relative %: 63 % (ref 43.0–77.0)
Platelets: 188 10*3/uL (ref 150.0–400.0)
RBC: 5.32 Mil/uL (ref 4.22–5.81)
RDW: 13.1 % (ref 11.5–15.5)
WBC: 8.1 10*3/uL (ref 4.0–10.5)

## 2020-01-05 LAB — COMPREHENSIVE METABOLIC PANEL
ALT: 19 U/L (ref 0–53)
AST: 15 U/L (ref 0–37)
Albumin: 4.4 g/dL (ref 3.5–5.2)
Alkaline Phosphatase: 83 U/L (ref 39–117)
BUN: 17 mg/dL (ref 6–23)
CO2: 25 mEq/L (ref 19–32)
Calcium: 9.3 mg/dL (ref 8.4–10.5)
Chloride: 104 mEq/L (ref 96–112)
Creatinine, Ser: 0.96 mg/dL (ref 0.40–1.50)
GFR: 82.19 mL/min (ref >60.00)
Glucose, Bld: 95 mg/dL (ref 70–99)
Potassium: 4.1 mEq/L (ref 3.5–5.1)
Sodium: 139 mEq/L (ref 135–145)
Total Bilirubin: 1.5 mg/dL — ABNORMAL HIGH (ref 0.2–1.2)
Total Protein: 7 g/dL (ref 6.0–8.3)

## 2020-01-05 LAB — LIPID PANEL
Cholesterol: 204 mg/dL — ABNORMAL HIGH (ref 0–200)
HDL: 46 mg/dL (ref >39.00)
LDL Cholesterol: 135 mg/dL — ABNORMAL HIGH (ref 0–99)
NonHDL: 157.55
Total CHOL/HDL Ratio: 4
Triglycerides: 114 mg/dL (ref 0.0–149.0)
VLDL: 22.8 mg/dL (ref 0.0–40.0)

## 2020-01-05 LAB — HEMOGLOBIN A1C: Hgb A1c MFr Bld: 5.3 % (ref 4.6–6.5)

## 2020-01-05 NOTE — Patient Instructions (Addendum)
Great job on weight loss- keep up the great work!   Please stop by lab before you go If you do not have mychart- we will call you about results within 5 business days of [place] receiving them.  If you have mychart- we will send your results within 3 business days of [place] receiving them.  If abnormal or we want to clarify a result, we will call or mychart you to make sure you receive the message.  If you have questions or concerns or don't hear within 5-7 days, please send [place] a message or call us.   Recommended follow up: Return in about 1 year (around 01/04/2021) for physical or sooner if needed.

## 2020-01-11 LAB — TESTOS,TOTAL,FREE AND SHBG (FEMALE)
Free Testosterone: 58.2 pg/mL (ref 35.0–155.0)
Sex Hormone Binding: 42 nmol/L (ref 10–50)
Testosterone, Total, LC-MS-MS: 407 ng/dL (ref 250–1100)

## 2020-06-21 ENCOUNTER — Encounter: Payer: Self-pay | Admitting: Family Medicine

## 2020-08-15 ENCOUNTER — Other Ambulatory Visit: Payer: BC Managed Care – PPO

## 2020-08-15 DIAGNOSIS — Z20822 Contact with and (suspected) exposure to covid-19: Secondary | ICD-10-CM

## 2020-08-16 LAB — NOVEL CORONAVIRUS, NAA: SARS-CoV-2, NAA: NOT DETECTED

## 2020-08-16 LAB — SARS-COV-2, NAA 2 DAY TAT

## 2021-01-14 NOTE — Patient Instructions (Addendum)
Please stop by lab before you go If you have mychart- we will send your results within 3 business days of [place] receiving them.  If you do not have mychart- we will call you about results within 5 business days of [place] receiving them.  *please also note that you will see labs on mychart as soon as they post. I will later go in and write notes on them- will say "notes from Dr. Yong Channel"  Counseling options.  1. Please call [phone] to schedule a visit with [facility] -Trey Paula is an excellent counselor who is based out of our clinic 2. [email or web address]  Health Maintenance Due  Topic Date Due  . Hepatitis C Screening Done today in office.  Never done   Recommended follow up: Return in about 1 year (around 01/16/2022) for physical or sooner if needed.

## 2021-01-14 NOTE — Progress Notes (Signed)
Phone: [phone]   Subjective:  Patient presents today for their annual physical. Chief complaint-noted.   See problem oriented charting- ROS- full  review of systems was completed and negative  except for: some anxiety  The following were reviewed and entered/updated in epic: Past Medical History:  Diagnosis Date  . Arthritis    [facility]- hip bilateral  . Chicken pox   . Gilbert's syndrome   . Hepatitis    junior in [place]. Jaundice mild. was told no further workup needed. ? Hep A. self resolved  . Hyperlipidemia    no med-diet controlled  . Kidney stone    x 2 - passed stone, no surgery required  . Seasonal allergies    Patient Active Problem List   Diagnosis Date Noted  . History of adenomatous polyp of colon 03/06/2018    Priority: Medium  . Hyperglycemia 12/23/2016    Priority: Medium  . Hyperlipidemia     Priority: Medium  . Gilberts syndrome 12/24/2016    Priority: Low  . Kidney stone     Priority: Low  . Arthritis     Priority: Low   Past Surgical History:  Procedure Laterality Date  . SHOULDER ARTHROSCOPY Right    rotator cuff. Dr. Latanya Maudlin [facility]  . WISDOM TOOTH EXTRACTION      Family History  Problem Relation Age of Onset  . Other Mother        not very open about her health  . Atrial fibrillation Father        has had some vertiog  . Healthy Brother        has had some vertigo, mild anxiety  . Arthritis Maternal Grandmother   . Colon cancer Maternal Grandmother        late life   . Hyperlipidemia Paternal Grandmother   . Heart disease Paternal Grandmother        20s  . Stroke Paternal Grandmother   . Hypertension Paternal Grandmother   . Prostate cancer Paternal Grandfather        lived near a plant  . CAD Maternal Grandfather        "hardening of arteries" "smoked like chimneys"  . Rectal cancer Neg Hx   . Stomach cancer Neg Hx     Medications- reviewed and updated Current Outpatient Medications  Medication Sig Dispense  Refill  . EPINEPHrine 0.3 mg/0.3 mL IJ SOAJ injection     . ibuprofen (ADVIL,MOTRIN) 200 MG tablet Take 400 mg by mouth every 6 (six) hours as needed for fever or moderate pain.    . tamsulosin (FLOMAX) 0.4 MG CAPS capsule Take 0.4 mg by mouth daily.    Marland Kitchen UNABLE TO FIND Med Name: Allergy Shots 3-4 per month at [facility].     No current facility-administered medications for this visit.    Allergies-reviewed and updated No Known Allergies  Social History   Social History Narrative   Married 1998. 2 daughters ('03, '06). Senior and freshman 12/2019. Oldest going to [employer].       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    Undergrad- [employer]   Masters- [employer]      Hobbies: play tennis, hiking, working on Immunologist to play guitar   Objective  Objective:  BP 116/88   Pulse 78   Temp (!) 97.3 F (36.3 C) (Temporal)   Ht 6\' 2"  (1.88 m)   Wt  232 lb 6.4 oz (105.4 kg)   SpO2 99%   BMI 29.84 kg/m  Gen: NAD, resting comfortably HEENT: Mucous membranes are moist. Oropharynx normal Neck: no thyromegaly CV: RRR no murmurs rubs or gallops Lungs: CTAB no crackles, wheeze, rhonchi Abdomen: soft/nontender/nondistended/normal bowel sounds. No rebound or guarding.  Ext: no edema Skin: warm, dry Neuro: grossly normal, moves all extremities, PERRLA    Assessment and Plan  55 y.o. male presenting for annual physical.  Health Maintenance counseling: 1. Anticipatory guidance: Patient counseled regarding regular dental exams -q6 months, eye exams -yearly with Dr. Satira Sark,  avoiding smoking and second hand smoke , limiting alcohol to 2 beverages per day as well below this at 2-3 drinks a month or less.   2. Risk factor reduction:  Advised patient of need for regular exercise and diet rich and fruits and vegetables to reduce risk of heart attack and stroke. Exercise-walking daily 30 mins- feels some guilt when not  looking for work. Diet- reasonably healthy- especially factoring in job loss/stress eating.  Weight stable over the last year-his long-term goal has been 208 by age 33 on home scales Wt Readings from Last 3 Encounters:  01/16/21 232 lb 6.4 oz (105.4 kg)  01/05/20 232 lb (105.2 kg)  07/19/19 238 lb (108 kg)  3. Immunizations/screenings/ancillary studies-discussed one-time lifetime hepatitis C screening .  Immunization History  Administered Date(s) Administered  . Influenza, High Dose Seasonal PF 10/02/2016, 09/24/2017, 09/29/2018, 10/09/2020  . Influenza-Unspecified 09/10/2016, 09/02/2017, 08/31/2018, 09/05/2019, 08/24/2020  . Janssen (J&J) SARS-COV-2 Vaccination 02/03/2020  . Moderna Sars-Covid-2 Vaccination 09/20/2020, 10/09/2020  . Td 12/05/2015  . Tdap 03/09/2006  . Zoster Recombinat (Shingrix) 12/30/2018, 04/12/2019  4. Prostate cancer screening-patient with grandfather with prostate cancer but lives near [place] and there was thought could be related to that.  Patient follows with Dr. Gilford Rile and gets PSA checks there.  Patient is on Flomax.  He prefers to do PSAs with his office  5. Colon cancer screening - March 01, 2018 with 5-year follow-up due to adenomatous colon polyp history 6. Skin cancer screening-follow-up with Dr. Ronnald Ramp agrees with dermatology. advised regular sunscreen use. Denies worrisome, changing, or new skin lesions.  7.  Never smoker 8. STD screening - only active with wife so not needed  Status of chronic or acute concerns   #anxiety/stress- looking for work right now and increasing anxiety. 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 Has had some very mild depressed mood as a result. Having some trouble getting back to sleep when wakes up to pee. Has tried caffeine but doesn't help anxiety.  Prospects are improving some with work and  that's helping some. Physically feels fine other than sleep- bed to 10 and out of bed by 6 or 6 30 but not always sleeping best after 2-4 AM if wakes up.  -discussed potential counseling - could consider meds if not improving with therapy- asked him to reach out to me through mychart  #hyperlipidemia S: Medication: None-ASCVD risk has been below 5%- 4.3%  Lab Results  Component Value Date   CHOL 204 (H) 01/05/2020   HDL 46.00 01/05/2020   LDLCALC 135 (H) 01/05/2020   TRIG 114.0 01/05/2020   CHOLHDL 4 01/05/2020   A/P: hopefully stable to improved- update lipids today  # Hyperglycemia/insulin resistance/prediabetes-A1c's have not been elevated but CBGs have been S:  Medication:  none Exercise and diet- see above Lab Results  Component Value Date   HGBA1C 5.3 01/05/2020   HGBA1C 5.3 12/30/2018   HGBA1C 5.3 12/23/2016   A/P: update a1c with labs today- has been good- also check fasting cbg  #Gilbert's syndrome-high bilirubin at baseline-trend LFTs  #Allergies-follows with Dr. Remus Blake still on twice a month shot.  Has EpiPen available if needed due to allergy shots  #Low testosterone concern-patient with some low energy/low libido testosterone and thyroid levels have been normal in the past.  No worsening symptoms    #Right shoulder surgery with Dr. Latanya Maudlin of [facility]-he is done well post surgery   Recommended follow up: Return in about 1 year (around 01/16/2022) for physical or sooner if needed.  Lab/Order associations: fasting   ICD-10-CM   1. Preventative health care  Z00.00 Lipid panel    CBC with Differential/Platelet    Hemoglobin A1c    Hepatitis C antibody    Comprehensive metabolic panel  2. Hyperlipidemia, unspecified hyperlipidemia type  E78.5 Lipid panel    CBC with Differential/Platelet    Comprehensive metabolic panel  3. Hyperglycemia  R73.9 Hemoglobin A1c  4. Encounter for hepatitis C screening test for low risk patient  Z11.59 Hepatitis C antibody    Return precautions advised.  Garret Reddish, MD

## 2021-01-16 ENCOUNTER — Ambulatory Visit (INDEPENDENT_AMBULATORY_CARE_PROVIDER_SITE_OTHER): Payer: BC Managed Care – PPO | Admitting: Family Medicine

## 2021-01-16 ENCOUNTER — Other Ambulatory Visit: Payer: Self-pay

## 2021-01-16 ENCOUNTER — Encounter: Payer: Self-pay | Admitting: Family Medicine

## 2021-01-16 VITALS — BP 116/88 | HR 78 | Temp 97.3°F | Ht 74.0 in | Wt 232.4 lb

## 2021-01-16 DIAGNOSIS — Z0000 Encounter for general adult medical examination without abnormal findings: Secondary | ICD-10-CM | POA: Diagnosis not present

## 2021-01-16 DIAGNOSIS — Z1159 Encounter for screening for other viral diseases: Secondary | ICD-10-CM

## 2021-01-16 DIAGNOSIS — R739 Hyperglycemia, unspecified: Secondary | ICD-10-CM

## 2021-01-16 DIAGNOSIS — E785 Hyperlipidemia, unspecified: Secondary | ICD-10-CM | POA: Diagnosis not present

## 2021-01-16 LAB — CBC WITH DIFFERENTIAL/PLATELET
Basophils Absolute: 0 10*3/uL (ref 0.0–0.1)
Basophils Relative: 0.4 % (ref 0.0–3.0)
Eosinophils Absolute: 0.2 10*3/uL (ref 0.0–0.7)
Eosinophils Relative: 2.8 % (ref 0.0–5.0)
HCT: 48.3 % (ref 39.0–52.0)
Hemoglobin: 16.6 g/dL (ref 13.0–17.0)
Lymphocytes Relative: 22 % (ref 12.0–46.0)
Lymphs Abs: 1.7 10*3/uL (ref 0.7–4.0)
MCHC: 34.3 g/dL (ref 30.0–36.0)
MCV: 89.2 fl (ref 78.0–100.0)
Monocytes Absolute: 0.5 10*3/uL (ref 0.1–1.0)
Monocytes Relative: 6.7 % (ref 3.0–12.0)
Neutro Abs: 5.2 10*3/uL (ref 1.4–7.7)
Neutrophils Relative %: 68.1 % (ref 43.0–77.0)
Platelets: 182 10*3/uL (ref 150.0–400.0)
RBC: 5.41 Mil/uL (ref 4.22–5.81)
RDW: 13.2 % (ref 11.5–15.5)
WBC: 7.6 10*3/uL (ref 4.0–10.5)

## 2021-01-16 LAB — LIPID PANEL
Cholesterol: 213 mg/dL — ABNORMAL HIGH (ref 0–200)
HDL: 55.3 mg/dL (ref >39.00)
LDL Cholesterol: 144 mg/dL — ABNORMAL HIGH (ref 0–99)
NonHDL: 158.11
Total CHOL/HDL Ratio: 4
Triglycerides: 69 mg/dL (ref 0.0–149.0)
VLDL: 13.8 mg/dL (ref 0.0–40.0)

## 2021-01-16 LAB — COMPREHENSIVE METABOLIC PANEL
ALT: 16 U/L (ref 0–53)
AST: 15 U/L (ref 0–37)
Albumin: 4.6 g/dL (ref 3.5–5.2)
Alkaline Phosphatase: 69 U/L (ref 39–117)
BUN: 14 mg/dL (ref 6–23)
CO2: 31 mEq/L (ref 19–32)
Calcium: 10 mg/dL (ref 8.4–10.5)
Chloride: 102 mEq/L (ref 96–112)
Creatinine, Ser: 1.06 mg/dL (ref 0.40–1.50)
GFR: 80.29 mL/min (ref >60.00)
Glucose, Bld: 94 mg/dL (ref 70–99)
Potassium: 5 mEq/L (ref 3.5–5.1)
Sodium: 138 mEq/L (ref 135–145)
Total Bilirubin: 1.7 mg/dL — ABNORMAL HIGH (ref 0.2–1.2)
Total Protein: 7.5 g/dL (ref 6.0–8.3)

## 2021-01-16 LAB — HEMOGLOBIN A1C: Hgb A1c MFr Bld: 5.2 % (ref 4.6–6.5)

## 2021-01-17 LAB — HEPATITIS C ANTIBODY
Hepatitis C Ab: NONREACTIVE
SIGNAL TO CUT-OFF: 0 (ref <1.00)

## 2022-01-08 NOTE — Progress Notes (Signed)
Phone: [phone]   Subjective:  Patient presents today for their annual physical. Chief complaint-noted.   See problem oriented charting- ROS- full  review of systems was completed and negative  except for: some stress   The following were reviewed and entered/updated in epic: Past Medical History:  Diagnosis Date   Arthritis    [facility]- hip bilateral   Chicken pox    Gilbert's syndrome    Hepatitis    junior in [place]. Jaundice mild. was told no further workup needed. ? Hep A. self resolved   Hyperlipidemia    no med-diet controlled   Kidney stone    x 2 - passed stone, no surgery required   Seasonal allergies    Patient Active Problem List   Diagnosis Date Noted   History of adenomatous polyp of colon 03/06/2018    Priority: Medium    Hyperglycemia 12/23/2016    Priority: Medium    Hyperlipidemia     Priority: Medium    Gilberts syndrome 12/24/2016    Priority: Low   Kidney stone     Priority: Low   Arthritis     Priority: Low   Past Surgical History:  Procedure Laterality Date   SHOULDER ARTHROSCOPY Right    rotator cuff. Dr. Latanya Maudlin [facility]   WISDOM TOOTH EXTRACTION      Family History  Problem Relation Age of Onset   Other Mother        not very open about her health   Atrial fibrillation Father        has had some vertiog   Healthy Brother        has had some vertigo, mild anxiety   Arthritis Maternal Grandmother    Colon cancer Maternal Grandmother        late life    Hyperlipidemia Paternal Grandmother    Heart disease Paternal Grandmother        18s   Stroke Paternal Grandmother    Hypertension Paternal Grandmother    Prostate cancer Paternal Grandfather        lived near a plant   CAD Maternal Grandfather        "hardening of arteries" "smoked like chimneys"   Rectal cancer Neg Hx    Stomach cancer Neg Hx     Medications- reviewed and updated Current Outpatient Medications  Medication Sig Dispense Refill   EPINEPHrine 0.3  mg/0.3 mL IJ SOAJ injection      ibuprofen (ADVIL,MOTRIN) 200 MG tablet Take 400 mg by mouth every 6 (six) hours as needed for fever or moderate pain.     tamsulosin (FLOMAX) 0.4 MG CAPS capsule Take 0.4 mg by mouth daily.     UNABLE TO FIND Med Name: Allergy Shots 3-4 per month at [facility].     No current facility-administered medications for this visit.    Allergies-reviewed and updated No Known Allergies  Social History   Social History Narrative   Married 1998. 2 daughters ('03, '06). Olidest at [employer], youngest junior at [employer] in 2023.       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    Undergrad- [employer]   Masters- [employer]      Hobbies: play tennis, hiking, working on Immunologist to play guitar   Objective  Objective:  BP 100/62    Pulse 82    Temp 98.3 F (  36.8 C)    Ht 6\' 2"  (1.88 m)    Wt 242 lb (109.8 kg)    SpO2 98%    BMI 31.07 kg/m  Gen: NAD, resting comfortably HEENT: Mucous membranes are moist. Oropharynx normal Neck: no thyromegaly CV: RRR no murmurs rubs or gallops Lungs: CTAB no crackles, wheeze, rhonchi Abdomen: soft/nontender/nondistended/normal bowel sounds. No rebound or guarding.  Ext: no edema Skin: warm, dry Neuro: grossly normal, moves all extremities, PERRLA   Assessment and Plan  56 y.o. male presenting for annual physical.  Health Maintenance counseling: 1. Anticipatory guidance: Patient counseled regarding regular dental exams -q6 months, eye exams -yearly with Dr. Satira Sark ,  avoiding smoking and second hand smoke , limiting alcohol to 2 beverages per day- as well below this at 2-3 drinks a month or less.   No illicit drugs.  2. Risk factor reduction:  Advised patient of need for regular exercise and diet rich and fruits and vegetables to reduce risk of heart attack and stroke.  Exercise-walking daily 30 mins daily.   Diet/weight management-some stress related weight gain in last year- had angst as looking for next steps. Feels he needs to cut down on sugar intake- big candy dish at work- also wants to cut down on salty snacks. Has made some sleep adjustments as well Wt Readings from Last 3 Encounters:  01/17/22 242 lb (109.8 kg)  01/16/21 232 lb 6.4 oz (105.4 kg)  01/05/20 232 lb (105.2 kg)  3. Immunizations/screenings/ancillary studies DISCUSSED:  -COVID booster vaccination #4-  wants to hold off for now Immunization History  Administered Date(s) Administered   Influenza, High Dose Seasonal PF 10/02/2016, 09/24/2017, 09/29/2018, 10/09/2020   Influenza-Unspecified 09/10/2016, 09/02/2017, 08/31/2018, 09/05/2019, 08/24/2020   Janssen (J&J) SARS-COV-2 Vaccination 02/03/2020   Moderna Sars-Covid-2 Vaccination 09/20/2020, 10/09/2020   Td 12/05/2015   Tdap 03/09/2006   Zoster Recombinat (Shingrix) 12/30/2018, 04/12/2019   4. Prostate cancer screening- -patient with grandfather with prostate cancer but lives near [place] and there was thought could be related to that.  Patient follows with Dr. Gilford Rile and gets PSA checks there.  Patient is on Flomax.  He prefers to do PSAs with his office. He prefers to go yearly for checks but if he goes to q2 years we could check psa in between- sees them in April.  5. Colon cancer screening - 03/01/18 with 5 year repeat planned-due to adenomatous colon polyp history 6. Skin cancer screening--follow-up with Dr. Ronnald Ramp agrees with dermatology advised regular sunscreen use. Denies worrisome, changing, or new skin lesions.  7. Smoking associated screening (lung cancer screening, AAA screen 65-75, UA)- Never smoker 8. STD screening - only active with wife so not needed  Status of chronic or acute concerns   #anxiety/stress- was looking for work around the time of last physical in 2022 after losing job in 2021 which increased anxiety.  Had discussed therapy potentially. - he states  extremes of anxiety are gone but at baseline tends to be an anxious person. No problem falling asleep- gets up to pee even with flomax and hard to get back to sleep.    #hyperlipidemia S: Medication: None-ASCVD risk has been below 5% Lab Results  Component Value Date   CHOL 213 (H) 01/16/2021   HDL 55.30 01/16/2021   LDLCALC 144 (H) 01/16/2021   TRIG 69.0 01/16/2021   CHOLHDL 4 01/16/2021  A/P: The 10-year ASCVD risk score (Arnett DK, et al., 2019) is: 3.3%. as long as remains below 5% hold off on further  evaluation  - he is interested in high sensitivity CRP- we discussed there are some limitations but if elevated would likely do ct cardiac scoring to determine whether we wanted to get more aggressive on lifestyle if there is plaque or consider statin.    # Hyperglycemia/insulin resistance/prediabetes-A1c's have not been elevated but CBGs have been in the past-A1c max 5.3 S:  Medication: none Exercise and diet- see above Lab Results  Component Value Date   HGBA1C 5.2 01/16/2021  A/P: he prefers to continue a1c checks- will dow ith labs   #Gilbert's syndrome-high bilirubin at baseline-trend LFTs    #Allergies-followed with Dr. Remus Blake still on twice a month immunotherapy.  Has EpiPen available if needed due to allergy shots     Recommended follow up: Return in about 1 year (around 01/17/2023) for physical or sooner if needed.  Lab/Order associations: fasting   ICD-10-CM   1. Preventative health care  Z00.00     2. Hyperlipidemia, unspecified hyperlipidemia type  E78.5     3. Hyperglycemia  R73.9     4. Gilberts syndrome  E80.4     5. Anxiety  F41.9       No orders of the defined types were placed in this encounter.  I,Jada Bradford,acting as a scribe for Garret Reddish, MD.,have documented all relevant documentation on the behalf of Garret Reddish, MD,as directed by  Garret Reddish, MD while in the presence of Garret Reddish, MD.  I, Garret Reddish, MD, have reviewed all  documentation for this visit. The documentation on 01/17/22 for the exam, diagnosis, procedures, and orders are all accurate and complete.  Return precautions advised.  Garret Reddish, MD

## 2022-01-17 ENCOUNTER — Other Ambulatory Visit: Payer: Self-pay

## 2022-01-17 ENCOUNTER — Encounter: Payer: Self-pay | Admitting: Family Medicine

## 2022-01-17 ENCOUNTER — Ambulatory Visit (INDEPENDENT_AMBULATORY_CARE_PROVIDER_SITE_OTHER): Payer: 59 | Admitting: Family Medicine

## 2022-01-17 VITALS — BP 100/62 | HR 82 | Temp 98.3°F | Ht 74.0 in | Wt 242.0 lb

## 2022-01-17 DIAGNOSIS — E785 Hyperlipidemia, unspecified: Secondary | ICD-10-CM | POA: Diagnosis not present

## 2022-01-17 DIAGNOSIS — R739 Hyperglycemia, unspecified: Secondary | ICD-10-CM | POA: Diagnosis not present

## 2022-01-17 DIAGNOSIS — Z0000 Encounter for general adult medical examination without abnormal findings: Secondary | ICD-10-CM | POA: Diagnosis not present

## 2022-01-17 DIAGNOSIS — F419 Anxiety disorder, unspecified: Secondary | ICD-10-CM

## 2022-01-17 LAB — COMPREHENSIVE METABOLIC PANEL
ALT: 19 U/L (ref 0–53)
AST: 16 U/L (ref 0–37)
Albumin: 4.4 g/dL (ref 3.5–5.2)
Alkaline Phosphatase: 68 U/L (ref 39–117)
BUN: 16 mg/dL (ref 6–23)
CO2: 30 mEq/L (ref 19–32)
Calcium: 9.3 mg/dL (ref 8.4–10.5)
Chloride: 103 mEq/L (ref 96–112)
Creatinine, Ser: 1.05 mg/dL (ref 0.40–1.50)
GFR: 80.64 mL/min (ref >60.00)
Glucose, Bld: 91 mg/dL (ref 70–99)
Potassium: 4.2 mEq/L (ref 3.5–5.1)
Sodium: 137 mEq/L (ref 135–145)
Total Bilirubin: 2.1 mg/dL — ABNORMAL HIGH (ref 0.2–1.2)
Total Protein: 6.7 g/dL (ref 6.0–8.3)

## 2022-01-17 LAB — CBC WITH DIFFERENTIAL/PLATELET
Basophils Absolute: 0 10*3/uL (ref 0.0–0.1)
Basophils Relative: 0.6 % (ref 0.0–3.0)
Eosinophils Absolute: 0.3 10*3/uL (ref 0.0–0.7)
Eosinophils Relative: 4.2 % (ref 0.0–5.0)
HCT: 45.5 % (ref 39.0–52.0)
Hemoglobin: 15.7 g/dL (ref 13.0–17.0)
Lymphocytes Relative: 25.4 % (ref 12.0–46.0)
Lymphs Abs: 1.6 10*3/uL (ref 0.7–4.0)
MCHC: 34.6 g/dL (ref 30.0–36.0)
MCV: 88.5 fl (ref 78.0–100.0)
Monocytes Absolute: 0.5 10*3/uL (ref 0.1–1.0)
Monocytes Relative: 7.5 % (ref 3.0–12.0)
Neutro Abs: 3.9 10*3/uL (ref 1.4–7.7)
Neutrophils Relative %: 62.3 % (ref 43.0–77.0)
Platelets: 174 10*3/uL (ref 150.0–400.0)
RBC: 5.14 Mil/uL (ref 4.22–5.81)
RDW: 13.5 % (ref 11.5–15.5)
WBC: 6.3 10*3/uL (ref 4.0–10.5)

## 2022-01-17 LAB — LIPID PANEL
Cholesterol: 202 mg/dL — ABNORMAL HIGH (ref 0–200)
HDL: 47.1 mg/dL (ref >39.00)
LDL Cholesterol: 136 mg/dL — ABNORMAL HIGH (ref 0–99)
NonHDL: 154.43
Total CHOL/HDL Ratio: 4
Triglycerides: 93 mg/dL (ref 0.0–149.0)
VLDL: 18.6 mg/dL (ref 0.0–40.0)

## 2022-01-17 LAB — HEMOGLOBIN A1C: Hgb A1c MFr Bld: 5.3 % (ref 4.6–6.5)

## 2022-01-17 LAB — HIGH SENSITIVITY CRP: CRP, High Sensitivity: 6.32 mg/L — ABNORMAL HIGH (ref 0.000–5.000)

## 2022-01-17 NOTE — Patient Instructions (Addendum)
Please stop by lab before you go If you have mychart- we will send your results within 3 business days of [place] receiving them.  If you do not have mychart- we will call you about results within 5 business days of [place] receiving them.  *please also note that you will see labs on mychart as soon as they post. I will later go in and write notes on them- will say "notes from Dr. Rock Nephew job on exercise- would love perhaps more sustained pace if possible  Also lets work on gradual weigt loss- even 5-10 lbs over the next year would be excellent  Recommended follow up: Return in about 1 year (around 01/17/2023) for physical or sooner if needed.

## 2022-01-20 ENCOUNTER — Encounter: Payer: Self-pay | Admitting: Family Medicine

## 2022-01-21 ENCOUNTER — Other Ambulatory Visit: Payer: Self-pay

## 2022-01-21 DIAGNOSIS — E785 Hyperlipidemia, unspecified: Secondary | ICD-10-CM

## 2022-01-29 ENCOUNTER — Other Ambulatory Visit: Payer: Self-pay

## 2022-01-29 ENCOUNTER — Other Ambulatory Visit (INDEPENDENT_AMBULATORY_CARE_PROVIDER_SITE_OTHER): Payer: 59

## 2022-01-29 DIAGNOSIS — E785 Hyperlipidemia, unspecified: Secondary | ICD-10-CM

## 2022-01-29 LAB — HIGH SENSITIVITY CRP: CRP, High Sensitivity: 5.19 mg/L — ABNORMAL HIGH (ref 0.000–5.000)

## 2022-01-30 ENCOUNTER — Encounter: Payer: Self-pay | Admitting: Family Medicine

## 2022-05-09 ENCOUNTER — Ambulatory Visit (INDEPENDENT_AMBULATORY_CARE_PROVIDER_SITE_OTHER): Payer: 59 | Admitting: Physician Assistant

## 2022-05-09 ENCOUNTER — Encounter: Payer: Self-pay | Admitting: Physician Assistant

## 2022-05-09 VITALS — BP 120/80 | HR 94 | Temp 98.1°F | Ht 74.0 in | Wt 245.0 lb

## 2022-05-09 DIAGNOSIS — W57XXXA Bitten or stung by nonvenomous insect and other nonvenomous arthropods, initial encounter: Secondary | ICD-10-CM | POA: Diagnosis not present

## 2022-05-09 DIAGNOSIS — S70261A Insect bite (nonvenomous), right hip, initial encounter: Secondary | ICD-10-CM

## 2022-05-09 DIAGNOSIS — R209 Unspecified disturbances of skin sensation: Secondary | ICD-10-CM | POA: Diagnosis not present

## 2022-05-09 NOTE — Patient Instructions (Signed)
It was great to see you!  I do not suspect a blood clot -- keep area elevated and consider compression -- if no better in two weeks let us know. If NEW symptoms, call us.  The tick bite area looks like scar tissue. I am not concerned about this. Should you develop new symptoms, such as flu-like symptoms, worsening rash/pain in that area, let me or Dr Yong Channel know  Take care,  Inda Coke PA-C

## 2022-05-09 NOTE — Progress Notes (Signed)
Jonathan Knox is a 56 y.o. male here for a new problem.  History of Present Illness:   Chief Complaint  Patient presents with   Insect Bite    Pt was bitten by a tick 04/01/2022 more than amonth ago right hip area,  area is red and raised, slight itching.   vein problem    Pt has a protruding vein on left lower leg, starting to aggravate him, he can feel it more.    HPI  Tick bite on R hip May 9 -- found it while taking a shower. Pulled it out with tweezers. He is uncertain if he got it all out because there is a slight lump there -- his wife is worried about this. Denies: fevers, fatigue, joint pain, prior tick borne illness. Has not put anything on this.  He did also happen to have two colds and a cough since this -- this is abnormal for him. But he also admits to allergies. These did not feel like flu-like illness. He has not other rashes. No discharge from lesion.   Protruding vein of right lower leg Has had a "protruding vein" on his left lower leg x 20-25 years. There have been about 3 instances during his lifetime that it has had symptoms -- like a tugging sensation. Usually these last about 15 min. He experienced this last night but when he walked his dog this morning he noticed it constantly.  Denies: prior blood clot, recent  cancer, recent travel/immobility, CP, SOB, numbness/tingling to LE  Hast not tried anything for this.    Past Medical History:  Diagnosis Date   Arthritis    [facility]- hip bilateral   Chicken pox    Gilbert's syndrome    Hepatitis    junior in [place]. Jaundice mild. was told no further workup needed. ? Hep A. self resolved   Hyperlipidemia    no med-diet controlled   Kidney stone    x 2 - passed stone, no surgery required   Seasonal allergies      Social History   Tobacco Use   Smoking status: Never   Smokeless tobacco: Never  Vaping Use   Vaping Use: Never used  Substance Use Topics   Alcohol use: Yes    Comment: Social 2  beers/wine a month   Drug use: No    Past Surgical History:  Procedure Laterality Date   SHOULDER ARTHROSCOPY Right    rotator cuff. Dr. Latanya Maudlin [facility]   WISDOM TOOTH EXTRACTION      Family History  Problem Relation Age of Onset   Other Mother        not very open about her health   Atrial fibrillation Father        has had some vertiog   Healthy Brother        has had some vertigo, mild anxiety   Arthritis Maternal Grandmother    Colon cancer Maternal Grandmother        late life    Hyperlipidemia Paternal Grandmother    Heart disease Paternal Grandmother        6s   Stroke Paternal Grandmother    Hypertension Paternal Grandmother    Prostate cancer Paternal Grandfather        lived near a plant   CAD Maternal Grandfather        "hardening of arteries" "smoked like chimneys"   Rectal cancer Neg Hx    Stomach cancer Neg Hx  No Known Allergies  Current Medications:   Current Outpatient Medications:    EPINEPHrine 0.3 mg/0.3 mL IJ SOAJ injection, , Disp: , Rfl:    ibuprofen (ADVIL,MOTRIN) 200 MG tablet, Take 400 mg by mouth every 6 (six) hours as needed for fever or moderate pain., Disp: , Rfl:    tamsulosin (FLOMAX) 0.4 MG CAPS capsule, Take 0.4 mg by mouth daily., Disp: , Rfl:    UNABLE TO FIND, Med Name: Allergy Shots 3-4 per month at [facility]., Disp: , Rfl:    Review of Systems:   ROS Negative unless otherwise specified per HPI.   Vitals:   Vitals:   05/09/22 1519  BP: 120/80  Pulse: 94  Temp: 98.1 F (36.7 C)  TempSrc: Temporal  SpO2: 98%  Weight: 245 lb (111.1 kg)  Height: '6\' 2"'$  (1.88 m)     Body mass index is 31.46 kg/m.  Physical Exam:   Physical Exam Vitals and nursing note reviewed.  Constitutional:      General: He is not in acute distress.    Appearance: He is well-developed. He is not ill-appearing or toxic-appearing.  Cardiovascular:     Rate and Rhythm: Normal rate and regular rhythm.     Pulses: Normal pulses.           Dorsalis pedis pulses are 2+ on the right side and 2+ on the left side.     Heart sounds: Normal heart sounds, S1 normal and S2 normal.  Pulmonary:     Effort: Pulmonary effort is normal.     Breath sounds: Normal breath sounds.  Musculoskeletal:     Comments: Lower R leg: slight protrusion of vein on anterior aspect of lower leg; no TTP; no calf swelling/erythema Negative Homan's sign  Skin:    General: Skin is warm and dry.     Comments: Posterior R hip: approximately 1/2 cm erythematous indurated lesion without surrounding/expanding erythema or warmth    Neurological:     Mental Status: He is alert.     GCS: GCS eye subscore is 4. GCS verbal subscore is 5. GCS motor subscore is 6.  Psychiatric:        Speech: Speech normal.        Behavior: Behavior normal. Behavior is cooperative.     Assessment and Plan:   Insect bite of right hip, initial encounter No red flags I do not think that the area is infected, nor is it amenable to I&D Recommend continued monitoring No worrisome symptoms If new sx arise, recommend close follow-up with PCP  Abnormal sensation of leg No red flags Suspect possible varicose vein Wells Criteria for DVT is 0 Recommend continued monitoring of symptoms; provided ACE bandage for localized compression If not resolved in two weeks, will consider vein and vascular referral If new sx arise, low threshold to obtain venous doppler    Inda Coke, PA-C

## 2022-07-30 ENCOUNTER — Encounter: Payer: Self-pay | Admitting: Family Medicine

## 2022-08-08 ENCOUNTER — Encounter: Payer: Self-pay | Admitting: Family Medicine

## 2022-08-08 ENCOUNTER — Ambulatory Visit (INDEPENDENT_AMBULATORY_CARE_PROVIDER_SITE_OTHER): Payer: 59 | Admitting: Family Medicine

## 2022-08-08 VITALS — BP 116/74 | HR 77 | Temp 98.1°F | Ht 74.0 in | Wt 245.2 lb

## 2022-08-08 DIAGNOSIS — Z566 Other physical and mental strain related to work: Secondary | ICD-10-CM | POA: Diagnosis not present

## 2022-08-08 DIAGNOSIS — R065 Mouth breathing: Secondary | ICD-10-CM

## 2022-08-08 DIAGNOSIS — R41840 Attention and concentration deficit: Secondary | ICD-10-CM | POA: Diagnosis not present

## 2022-08-08 DIAGNOSIS — J3489 Other specified disorders of nose and nasal sinuses: Secondary | ICD-10-CM

## 2022-08-08 MED ORDER — BUPROPION HCL ER (XL) 150 MG PO TB24: 150.0000 mg | ORAL_TABLET | Freq: Every day | ORAL | 5 refills | Status: DC

## 2022-08-08 NOTE — Patient Instructions (Addendum)
Let us know when you get your flu shot at work.  We or ENT will call you within two weeks about your referral to ENT. If you do not hear within 2 weeks, give [facility] call directly [phone]  with ongoing attention concerns- he feels like this bleeds over into causing anxiety as unable to finish tasks with work- enen though homelife is good lingering stress from work is a barrier to his health and sleep. Also feels low energy in day from poor sleep.  -advised no phone before bed for at least 30 minutes and preferably keep phone out of bedroom. Read book if cant fall back to sleep at night- sit in a different area until you feel tired then get back in bed.  - trial wellbutrin 150 mg XR in the morning- would have some potential benefit for energy as well as focus. If energy/focus better in daytime hoping will not have the lingering sleep/anxiety issues -if not effective could bump dose vs. Consider therapy -2 month follow up recommended -discussed briefly referral to [place] attention specialist or [facility] down the line

## 2022-08-08 NOTE — Progress Notes (Signed)
Phone [phone] In person visit   Subjective:   Jonathan Knox is a 56 y.o. year old very pleasant male patient who presents for/with See problem oriented charting Chief Complaint  Patient presents with   difficutly breathing through nose    Pt states impossible to nose breathe when sleeping.    ADHD    Pt states focus has been a huge problem over the last few years as my brain goes down every rabbit hole available, and I just can't seem to shut my brain off.   Past Medical History-  Patient Active Problem List   Diagnosis Date Noted   History of adenomatous polyp of colon 03/06/2018    Priority: Medium    Hyperglycemia 12/23/2016    Priority: Medium    Hyperlipidemia     Priority: Medium    Gilberts syndrome 12/24/2016    Priority: Low   Kidney stone     Priority: Low   Arthritis     Priority: Low    Medications- reviewed and updated Current Outpatient Medications  Medication Sig Dispense Refill   buPROPion (WELLBUTRIN XL) 150 MG 24 hr tablet Take 1 tablet (150 mg total) by mouth daily. 30 tablet 5   EPINEPHrine 0.3 mg/0.3 mL IJ SOAJ injection      tamsulosin (FLOMAX) 0.4 MG CAPS capsule Take 0.4 mg by mouth daily.     UNABLE TO FIND Med Name: Allergy Shots 3-4 per month at [facility]     ibuprofen (ADVIL,MOTRIN) 200 MG tablet Take 400 mg by mouth every 6 (six) hours as needed for fever or moderate pain.     No current facility-administered medications for this visit.     Objective:  BP 116/74   Pulse 77   Temp 98.1 F (36.7 C)   Ht '6\' 2"'$  (1.88 m)   Wt 245 lb 3.2 oz (111.2 kg)   SpO2 98%   BMI 31.48 kg/m  Gen: NAD, resting comfortably     Assessment and Plan   # mouth breathing S: Patient states that he found it difficult to breathe through his nose when sleeping-ends up mouth breathing  -states always had allergies and has been getting allergy shots for several years. Wears bite guard at night for teeth grinding. Has generally been able to  breath through the nose but even with allergies controlled- noting film in the mouth and dry mouth suggesting mouth breathing and doesn't feel liek he rests as well. Even in the daytime has to focus on breathing through nose- tendency more towards breathing through the mouth. Notes nasal wheezing even when does bring through the nose. Cant take as deep a breath through his nose A/P: increased difficulty with passing air through nasal passageways both at night and in the daytime wondering about potential obstructive process- will refer to ENT   #Attention concerns S: Patient is concerned for potentially having ADD.  He states his symptoms started at least in his 7s at least-  states very busy mind and will follow different rabbit holes. Copes by taking copious notes.  Feels like he has had more issues over the last few years.  Has a hard time focusing. He has had multiple people in his life (who do not have connections to each other) who have told him he should be tested for ADD. Has a hard time focusing at work. Has a hard time turning brain off before bed and hard to get to sleep- not harmful/destructive thoughts but could be exaggerated- about  work or Aeronautical engineer. Gets up and gets on his phone. Doesn't sleep well and then has worse energy -feels home life is awesome- stress is lingering work stress -has one on one meeting with boss each week- meeting got moved up the other week to 10 am and he became anxious had been laid off. He literally packed his desk up with thought he would be laid off- keeps desk light in case he is laid off (stress from previously being laid off) -At last visit in February mentioned ongoing difficulties with stress (worsened after losing job in 2012) but felt like at baseline was a pretty anxious person.  We had discussed possible therapy in the past  A/P: with ongoing attention concerns- he feels like this bleeds over into causing anxiety as unable to finish tasks with work-  enen though homelife is good lingering stress from work is a barrier to his health and sleep. Also feels low energy in day from poor sleep.  -advised no phone before bed for at least 30 minutes and preferably keep phone out of bedroom. Read book if cant fall back to sleep at night- sit in a different area until you feel tired then get back in bed.  - trial wellbutrin 150 mg XR- would have some potential benefit for energy as well as focus. If energy/focus better in daytime hoping will not have the lingering sleep/anxiety issues -if not effective could bump dose vs. Consider therapy -2 month follow up recommended -discussed briefly referral to [place] attention specialist or [facility] down the line  Recommended follow up: Return in about 2 months (around 10/08/2022) for followup or sooner if needed.Schedule b4 you leave. Future Appointments  Date Time Provider [place]  01/20/2023  8:00 AM Marin Olp, MD [facility]   Lab/Order associations:   ICD-10-CM   1. Mouth breathing  R06.5 Ambulatory referral to ENT    2. Nasal obstruction  J34.89 Ambulatory referral to ENT    3. Attention deficit  R41.840     4. Stress at work  Z56.6       Meds ordered this encounter  Medications   buPROPion (WELLBUTRIN XL) 150 MG 24 hr tablet    Sig: Take 1 tablet (150 mg total) by mouth daily.    Dispense:  30 tablet    Refill:  5   Time Spent: 32 minutes of total time (9:25 AM- 9:57 AM) was spent on the date of the encounter performing the following actions: chart review prior to seeing the patient, obtaining history, counseling on the treatment  options and ultimate plan as well as side effect sof meds , placing orders, and documenting in our EHR.   Return precautions advised.  Garret Reddish, MD

## 2022-08-28 NOTE — Telephone Encounter (Signed)
See below regarding Wellbutrin, referral question has been addressed.

## 2022-10-02 ENCOUNTER — Ambulatory Visit: Payer: 59 | Admitting: Family Medicine

## 2022-10-24 DIAGNOSIS — J343 Hypertrophy of nasal turbinates: Secondary | ICD-10-CM | POA: Insufficient documentation

## 2022-10-24 DIAGNOSIS — J301 Allergic rhinitis due to pollen: Secondary | ICD-10-CM | POA: Insufficient documentation

## 2023-01-20 ENCOUNTER — Ambulatory Visit (INDEPENDENT_AMBULATORY_CARE_PROVIDER_SITE_OTHER): Payer: 59 | Admitting: Family Medicine

## 2023-01-20 ENCOUNTER — Encounter: Payer: Self-pay | Admitting: Internal Medicine

## 2023-01-20 ENCOUNTER — Encounter: Payer: Self-pay | Admitting: Family Medicine

## 2023-01-20 VITALS — BP 120/82 | HR 79 | Temp 97.2°F | Ht 74.0 in | Wt 247.4 lb

## 2023-01-20 DIAGNOSIS — R739 Hyperglycemia, unspecified: Secondary | ICD-10-CM | POA: Diagnosis not present

## 2023-01-20 DIAGNOSIS — Z125 Encounter for screening for malignant neoplasm of prostate: Secondary | ICD-10-CM | POA: Diagnosis not present

## 2023-01-20 DIAGNOSIS — E785 Hyperlipidemia, unspecified: Secondary | ICD-10-CM | POA: Diagnosis not present

## 2023-01-20 DIAGNOSIS — Z0000 Encounter for general adult medical examination without abnormal findings: Secondary | ICD-10-CM | POA: Diagnosis not present

## 2023-01-20 DIAGNOSIS — Z1211 Encounter for screening for malignant neoplasm of colon: Secondary | ICD-10-CM

## 2023-01-20 LAB — LIPID PANEL
Cholesterol: 212 mg/dL — ABNORMAL HIGH (ref 0–200)
HDL: 50.3 mg/dL (ref >39.00)
LDL Cholesterol: 144 mg/dL — ABNORMAL HIGH (ref 0–99)
NonHDL: 161.34
Total CHOL/HDL Ratio: 4
Triglycerides: 89 mg/dL (ref 0.0–149.0)
VLDL: 17.8 mg/dL (ref 0.0–40.0)

## 2023-01-20 LAB — CBC WITH DIFFERENTIAL/PLATELET
Basophils Absolute: 0 10*3/uL (ref 0.0–0.1)
Basophils Relative: 0.5 % (ref 0.0–3.0)
Eosinophils Absolute: 0.5 10*3/uL (ref 0.0–0.7)
Eosinophils Relative: 7.1 % — ABNORMAL HIGH (ref 0.0–5.0)
HCT: 47.8 % (ref 39.0–52.0)
Hemoglobin: 16.5 g/dL (ref 13.0–17.0)
Lymphocytes Relative: 27.3 % (ref 12.0–46.0)
Lymphs Abs: 1.8 10*3/uL (ref 0.7–4.0)
MCHC: 34.5 g/dL (ref 30.0–36.0)
MCV: 89.3 fl (ref 78.0–100.0)
Monocytes Absolute: 0.4 10*3/uL (ref 0.1–1.0)
Monocytes Relative: 6.6 % (ref 3.0–12.0)
Neutro Abs: 3.8 10*3/uL (ref 1.4–7.7)
Neutrophils Relative %: 58.5 % (ref 43.0–77.0)
Platelets: 174 10*3/uL (ref 150.0–400.0)
RBC: 5.36 Mil/uL (ref 4.22–5.81)
RDW: 13.3 % (ref 11.5–15.5)
WBC: 6.5 10*3/uL (ref 4.0–10.5)

## 2023-01-20 LAB — COMPREHENSIVE METABOLIC PANEL
ALT: 22 U/L (ref 0–53)
AST: 16 U/L (ref 0–37)
Albumin: 4.2 g/dL (ref 3.5–5.2)
Alkaline Phosphatase: 74 U/L (ref 39–117)
BUN: 17 mg/dL (ref 6–23)
CO2: 28 mEq/L (ref 19–32)
Calcium: 9.5 mg/dL (ref 8.4–10.5)
Chloride: 104 mEq/L (ref 96–112)
Creatinine, Ser: 0.94 mg/dL (ref 0.40–1.50)
GFR: 91.44 mL/min (ref >60.00)
Glucose, Bld: 91 mg/dL (ref 70–99)
Potassium: 4.4 mEq/L (ref 3.5–5.1)
Sodium: 141 mEq/L (ref 135–145)
Total Bilirubin: 1.6 mg/dL — ABNORMAL HIGH (ref 0.2–1.2)
Total Protein: 6.7 g/dL (ref 6.0–8.3)

## 2023-01-20 LAB — HEMOGLOBIN A1C: Hgb A1c MFr Bld: 5.3 % (ref 4.6–6.5)

## 2023-01-20 LAB — PSA: PSA: 0.45 ng/mL (ref 0.10–4.00)

## 2023-01-20 NOTE — Patient Instructions (Addendum)
[facility] contact Please call to schedule visit and/or procedure Address: [address], [place], [place] [address] Phone: [phone]   Please stop by lab before you go If you have mychart- we will send your results within 3 business days of [place] receiving them.  If you do not have mychart- we will call you about results within 5 business days of [place] receiving them.  *please also note that you will see labs on mychart as soon as they post. I will later go in and write notes on them- will say "notes from Dr. Yong Channel"   Agree with seeing [facility] about hand pain  March commitments No food after 8 pm Three 20 minute walks a week  Recommended follow up: Return in about 1 year (around 01/21/2024) for physical or sooner if needed.Schedule b4 you leave.

## 2023-01-20 NOTE — Progress Notes (Signed)
Phone: [phone]   Subjective:  Patient presents today for their annual physical. Chief complaint-noted.   See problem oriented charting- ROS- full  review of systems was completed and negative  except for: urinary frequency, seasonal allergies, some hand pain at times with gripping- plans to see [facility], some cramping in left foot- occassionally right at end of long day  The following were reviewed and entered/updated in epic: Past Medical History:  Diagnosis Date   Arthritis    [facility]- hip bilateral   Chicken pox    Gilbert's syndrome    Hepatitis    junior in HS. Jaundice mild. was told no further workup needed. ? Hep A. self resolved   Hyperlipidemia    no med-diet controlled   Kidney stone    x 2 - passed stone, no surgery required   Seasonal allergies    Patient Active Problem List   Diagnosis Date Noted   History of adenomatous polyp of colon 03/06/2018    Priority: Medium    Hyperglycemia 12/23/2016    Priority: Medium    Hyperlipidemia     Priority: Medium    Gilberts syndrome 12/24/2016    Priority: Low   Kidney stone     Priority: Low   Arthritis     Priority: Low   Past Surgical History:  Procedure Laterality Date   SHOULDER ARTHROSCOPY Right    rotator cuff. Dr. Latanya Maudlin [facility]   WISDOM TOOTH EXTRACTION      Family History  Problem Relation Age of Onset   Other Mother        not very open about her health   Atrial fibrillation Father        has had some vertiog   Healthy Brother        has had some vertigo, mild anxiety   Arthritis Maternal Grandmother    Colon cancer Maternal Grandmother        late life    Hyperlipidemia Paternal Grandmother    Heart disease Paternal Grandmother        40s   Stroke Paternal Grandmother    Hypertension Paternal Grandmother    Prostate cancer Paternal Grandfather        lived near a plant   CAD Maternal Grandfather        "hardening of arteries" "smoked like chimneys"   Rectal cancer  Neg Hx    Stomach cancer Neg Hx     Medications- reviewed and updated Current Outpatient Medications  Medication Sig Dispense Refill   azelastine (ASTELIN) 0.1 % nasal spray Place into the nose.     EPINEPHrine 0.3 mg/0.3 mL IJ SOAJ injection      fluticasone (FLONASE) 50 MCG/ACT nasal spray Place into the nose.     tamsulosin (FLOMAX) 0.4 MG CAPS capsule Take 0.4 mg by mouth daily.     UNABLE TO FIND Med Name: Allergy Shots 3-4 per month at [facility].     No current facility-administered medications for this visit.    Allergies-reviewed and updated No Known Allergies  Social History   Social History Narrative   Married 1998. 2 daughters ('03, '06). 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    [place]  Hobbies: play tennis, hiking, working on learning to play guitar   Objective  Objective:  BP 120/82   Pulse 79   Temp (!) 97.2 F (36.2 C)   Ht '6\' 2"'$  (1.88 m)   Wt 247 lb 6.4 oz (112.2 kg)   SpO2 98%   BMI 31.76 kg/m  Gen: NAD, resting comfortably HEENT: Mucous membranes are moist. Oropharynx normal Neck: no thyromegaly CV: RRR no murmurs rubs or gallops Lungs: CTAB no crackles, wheeze, rhonchi Abdomen: soft/nontender/nondistended/normal bowel sounds. No rebound or guarding.  Ext: no edema Skin: warm, dry Neuro: grossly normal, moves all extremities, PERRLA    Assessment and Plan  57 y.o. male presenting for annual physical.  Health Maintenance counseling: 1. Anticipatory guidance: Patient counseled regarding regular dental exams -q6 months, eye exams -yearly w Dr. Satira Sark,  avoiding smoking and second hand smoke , limiting alcohol to 2 beverages per day - 2-3 a month or less, no illicit drugs.   2. Risk factor reduction:  Advised  patient of need for regular exercise and diet rich and fruits and vegetables to reduce risk of heart attack and stroke.  Exercise- not going well lately- had been walking dog but passed last year at 24.78 years old.  Would like to try morning for exercise- works best for him. We discussed adding back in- could also help with stressors Diet/weight management-weight up 5 lbs in last year- feels weight gain related to portion size/quantity- could cut back.  Wt Readings from Last 3 Encounters:  01/20/23 247 lb 6.4 oz (112.2 kg)  08/08/22 245 lb 3.2 oz (111.2 kg)  05/09/22 245 lb (111.1 kg)  3. Immunizations/screenings/ancillary studies- already had covid booster and flu shot Immunization History  Administered Date(s) Administered   Covid-19, Mrna,Vaccine(Spikevax)30yr and older 09/07/2022   Influenza, High Dose Seasonal PF 10/02/2016, 09/24/2017, 09/29/2018, 10/09/2020   Influenza-Unspecified 09/10/2016, 09/02/2017, 08/31/2018, 09/05/2019, 08/24/2020   Janssen (J&J) SARS-COV-2 Vaccination 02/03/2020   Moderna Sars-Covid-2 Vaccination 09/20/2020, 10/09/2020   Td 12/05/2015   Tdap 03/09/2006   Zoster Recombinat (Shingrix) 12/30/2018, 04/12/2019   4. Prostate cancer screening- -patient with grandfather with prostate cancer [place] could be related [place].  Patient follows with Dr. WGilford Rileand gets PSA checks there usually but we opted to obtain today.  Patient is on Flomax but still pees all the time- considering doubling dose. Also discussed double voiding. PSA last year 0.23.  5. Colon cancer screening - 03/01/18 with 5 year repeat planned-due to adenomatous colon polyp history- will be due in next few months- gave contact # 6. Skin cancer screening--follow-up with Dr. JRonnald Rampwith dermatology advised regular sunscreen use. Denies worrisome, changing, or new skin lesions.  7. Smoking associated screening (lung cancer screening, AAA screen 65-75, UA)- Never smoker 8.  STD screening - only active with wife so not needed  Status of chronic or acute concerns   #hyperlipidemia w elevated high sensitivity CRP S: Medication: None-ASCVD risk has been below 5% Lab Results  Component Value Date   CHOL 202 (H) 01/17/2022   HDL 47.10 01/17/2022   LDLCALC 136 (H) 01/17/2022   TRIG 93.0 01/17/2022   CHOLHDL 4 01/17/2022  The 10-year ASCVD risk score (Arnett DK, et al., 2019) is: 5.3% A/P: he prefers to work on diet and exercise as long as no substantial increase in lipids over doing ct calcium scoring- update lipids today    # Hyperglycemia/insulin resistance/prediabetes-A1c's have not been elevated but CBGs have been in  the past-A1c max 5.3 S:  Medication: none Exercise and diet- see above Lab Results  Component Value Date   HGBA1C 5.3 01/17/2022   HGBA1C 5.2 01/16/2021   HGBA1C 5.3 01/05/2020  A/P: hopefully stable- update a1c today.   #Gilbert's syndrome-high bilirubin at baseline-trend LFTs    #Allergies-followed with Dr. Remus Blake still on twice a month immunotherapy.  Has EpiPen available if needed due to allergy shots   # attention concerns- that lead to anxiety issues -have discussed therapy vs. ADD psychiatry evaluation - prior Wellbutrin was not a great fit- jumpy and not himself - later did meditation, deep breathing -anxiety side- worries about things like losing job  #Mouth breathing- saw Dr. Sabino Gasser with ENT through [facility]- trying nasal sprays for edema before considering surgery- combo astelin and flonase right now  Recommended follow up: Return in about 1 year (around 01/21/2024) for physical or sooner if needed.Schedule b4 you leave.  Lab/Order associations: fasting   ICD-10-CM   1. Preventative health care  Z00.00 CBC with Differential/Platelet    Comprehensive metabolic panel    Lipid panel    PSA    HgB A1c    Ambulatory referral to Gastroenterology    2. Hyperlipidemia, unspecified hyperlipidemia type  E78.5 CBC with  Differential/Platelet    Comprehensive metabolic panel    Lipid panel    3. Hyperglycemia  R73.9 HgB A1c    4. Screening for prostate cancer  Z12.5 PSA    5. Screen for colon cancer  Z12.11 Ambulatory referral to Gastroenterology     No orders of the defined types were placed in this encounter.  Return precautions advised.  Garret Reddish, MD

## 2023-02-13 ENCOUNTER — Ambulatory Visit (AMBULATORY_SURGERY_CENTER): Payer: 59

## 2023-02-13 VITALS — Ht 74.0 in | Wt 251.2 lb

## 2023-02-13 DIAGNOSIS — Z86010 Personal history of colonic polyps: Secondary | ICD-10-CM

## 2023-02-13 MED ORDER — NA SULFATE-K SULFATE-MG SULF 17.5-3.13-1.6 GM/177ML PO SOLN: 1.0000 | Freq: Once | ORAL | 0 refills | Status: AC

## 2023-02-13 NOTE — Progress Notes (Signed)
No egg or soy allergy known to patient  No issues known to pt with past sedation with any surgeries or procedures Patient denies ever being told they had issues or difficulty with intubation  No FH of Malignant Hyperthermia Pt is not on diet pills Pt is not on  home 02  Pt is not on blood thinners  Pt denies issues with constipation  No A fib or A flutter Have any cardiac testing pending--no Pt instructed to use Singlecare.com or GoodRx for a price reduction on prep  Patient's chart reviewed by Jonathan Knox CNRA prior to previsit and patient appropriate for the [facility].  Previsit completed and red dot placed by patient's name on their procedure day (on provider's schedule).    

## 2023-02-25 ENCOUNTER — Encounter: Payer: Self-pay | Admitting: Internal Medicine

## 2023-03-02 ENCOUNTER — Encounter: Payer: Self-pay | Admitting: Family Medicine

## 2023-03-13 ENCOUNTER — Ambulatory Visit (AMBULATORY_SURGERY_CENTER): Payer: 59 | Admitting: Internal Medicine

## 2023-03-13 ENCOUNTER — Encounter: Payer: Self-pay | Admitting: Internal Medicine

## 2023-03-13 VITALS — BP 112/71 | HR 72 | Temp 98.6°F | Resp 12 | Ht 74.0 in | Wt 251.2 lb

## 2023-03-13 DIAGNOSIS — D122 Benign neoplasm of ascending colon: Secondary | ICD-10-CM

## 2023-03-13 DIAGNOSIS — Z09 Encounter for follow-up examination after completed treatment for conditions other than malignant neoplasm: Secondary | ICD-10-CM

## 2023-03-13 DIAGNOSIS — Z86010 Personal history of colonic polyps: Secondary | ICD-10-CM | POA: Diagnosis not present

## 2023-03-13 MED ORDER — SODIUM CHLORIDE 0.9 % IV SOLN: 500.0000 mL | INTRAVENOUS | Status: DC

## 2023-03-13 NOTE — Patient Instructions (Signed)
Handouts provided on polyps and diverticulosis.   Resume previous diet. Continue present medications.  Await pathology results.  Repeat colonoscopy in 7-10 years for surveillance.   YOU HAD AN ENDOSCOPIC PROCEDURE TODAY AT THE [facility]:   Refer to the procedure report that was given to you for any specific questions about what was found during the examination.  If the procedure report does not answer your questions, please call your gastroenterologist to clarify.  If you requested that your care partner not be given the details of your procedure findings, then the procedure report has been included in a sealed envelope for you to review at your convenience later.  YOU SHOULD EXPECT: Some feelings of bloating in the abdomen. Passage of more gas than usual.  Walking can help get rid of the air that was put into your GI tract during the procedure and reduce the bloating. If you had a lower endoscopy (such as a colonoscopy or flexible sigmoidoscopy) you may notice spotting of blood in your stool or on the toilet paper. If you underwent a bowel prep for your procedure, you may not have a normal bowel movement for a few days.  Please Note:  You might notice some irritation and congestion in your nose or some drainage.  This is from the oxygen used during your procedure.  There is no need for concern and it should clear up in a day or so.  SYMPTOMS TO REPORT IMMEDIATELY:  Following lower endoscopy (colonoscopy or flexible sigmoidoscopy):  Excessive amounts of blood in the stool  Significant tenderness or worsening of abdominal pains  Swelling of the abdomen that is new, acute  Fever of 100F or higher  For urgent or emergent issues, a gastroenterologist can be reached at any hour by calling [phone]. Do not use MyChart messaging for urgent concerns.    DIET:  We do recommend a small meal at first, but then you may proceed to your regular diet.  Drink plenty of fluids but you  should avoid alcoholic beverages for 24 hours.  ACTIVITY:  You should plan to take it easy for the rest of today and you should NOT DRIVE or use heavy machinery until tomorrow (because of the sedation medicines used during the test).    FOLLOW UP: Our staff will call the number listed on your records the next business day following your procedure.  We will call around 7:15- 8:00 am to check on you and address any questions or concerns that you may have regarding the information given to you following your procedure. If we do not reach you, we will leave a message.     If any biopsies were taken you will be contacted by phone or by letter within the next 1-3 weeks.  Please call us at [phone] if you have not heard about the biopsies in 3 weeks.    SIGNATURES/CONFIDENTIALITY: You and/or your care partner have signed paperwork which will be entered into your electronic medical record.  These signatures attest to the fact that that the information above on your After Visit Summary has been reviewed and is understood.  Full responsibility of the confidentiality of this discharge information lies with you and/or your care-partner.  

## 2023-03-13 NOTE — Op Note (Signed)
[facility] Patient Name: Jonathan Knox Procedure Date: 03/13/2023 10:04 AM MRN: [id number] Endoscopist: Wilhemina Bonito. Marina Goodell , MD, [id number] Age: 57 Referring MD:  Date of Birth: [birth year] Gender: Male Account #: [id number] Procedure:                Colonoscopy with cold snare polypectomy x 1 Indications:              High risk colon cancer surveillance: Personal                            history of non-advanced adenoma. Previous                            examination 2019 Medicines:                Monitored Anesthesia Care Procedure:                Pre-Anesthesia Assessment:                           - Prior to the procedure, a History and Physical                            was performed, and patient medications and                            allergies were reviewed. The patient's tolerance of                            previous anesthesia was also reviewed. The risks                            and benefits of the procedure and the sedation                            options and risks were discussed with the patient.                            All questions were answered, and informed consent                            was obtained. Prior Anticoagulants: The patient has                            taken no anticoagulant or antiplatelet agents.                            After reviewing the risks and benefits, the patient                            was deemed in satisfactory condition to undergo the                            procedure.  After obtaining informed consent, the colonoscope                            was passed under direct vision. Throughout the                            procedure, the patient's blood pressure, pulse, and                            oxygen saturations were monitored continuously. The                            CF [id number] #[id number] was introduced through the anus                            and advanced to the the cecum,  identified by                            appendiceal orifice and ileocecal valve. The                            ileocecal valve, appendiceal orifice, and rectum                            were photographed. The quality of the bowel                            preparation was good. The colonoscopy was performed                            without difficulty. The patient tolerated the                            procedure well. The bowel preparation used was                            SUPREP via split dose instruction. Scope In: 10:19:55 AM Scope Out: 10:31:39 AM Scope Withdrawal Time: 0 hours 9 minutes 5 seconds  Total Procedure Duration: 0 hours 11 minutes 44 seconds  Findings:                 A 2 mm polyp was found in the ascending colon. The                            polyp was removed with a cold snare. Resection and                            retrieval were complete.                           A few diverticula were found in the ascending colon.                           The exam was otherwise without abnormality  on                            direct and retroflexion views. Complications:            No immediate complications. Estimated blood loss:                            None. Estimated Blood Loss:     Estimated blood loss: none. Impression:               - One 2 mm polyp in the ascending colon, removed                            with a cold snare. Resected and retrieved.                           - Diverticulosis in the ascending colon.                           - The examination was otherwise normal on direct                            and retroflexion views. Recommendation:           - Repeat colonoscopy in 7-10 years for surveillance.                           - Patient has a contact number available for                            emergencies. The signs and symptoms of potential                            delayed complications were discussed with the                             patient. Return to normal activities tomorrow.                            Written discharge instructions were provided to the                            patient.                           - Resume previous diet.                           - Continue present medications.                           - Await pathology results. Wilhemina Bonito. Marina Goodell, MD 03/13/2023 10:39:11 AM This report has been signed electronically.

## 2023-03-13 NOTE — Progress Notes (Signed)
A and O x3. Report to RN. Tolerated MAC anesthesia well. 

## 2023-03-13 NOTE — Progress Notes (Signed)
HISTORY OF PRESENT ILLNESS:  Jonathan Knox is a 57 y.o. male with a history of adenomatous colon polyp.  Previous exam 2019.  Now for surveillance  REVIEW OF SYSTEMS:  All non-GI ROS negative except for  Past Medical History:  Diagnosis Date   Arthritis    [facility]- hip bilateral   Chicken pox    Gilbert's syndrome    Hepatitis    junior in HS. Jaundice mild. was told no further workup needed. ? Hep A. self resolved   Hyperlipidemia    no med-diet controlled   Kidney stone    x 2 - passed stone, no surgery required   Seasonal allergies     Past Surgical History:  Procedure Laterality Date   COLONOSCOPY     SHOULDER ARTHROSCOPY Right    rotator cuff. Dr. Yisroel Ramming [facility]   WISDOM TOOTH EXTRACTION      Social History Jonathan Knox  reports that he has never smoked. He has never used smokeless tobacco. He reports current alcohol use. He reports that he does not use drugs.  family history includes Arthritis in his maternal grandmother; Atrial fibrillation in his father; CAD in his maternal grandfather; Colon cancer in his maternal grandmother; Healthy in his brother; Heart disease in his paternal grandmother; Hyperlipidemia in his paternal grandmother; Hypertension in his paternal grandmother; Other in his mother; Prostate cancer in his paternal grandfather; Stroke in his paternal grandmother.  No Active Allergies     PHYSICAL EXAMINATION: Vital signs: BP (!) 115/97   Pulse 89   Temp 98.6 F (37 C)   Resp 14   Ht  (1.88 m)   Wt 251 lb 3.2 oz (113.9 kg)   SpO2 99%   BMI 32.25 kg/m  General: Well-developed, well-nourished, no acute distress HEENT: Sclerae are anicteric, conjunctiva pink. Oral mucosa intact Lungs: Clear Heart: Regular Abdomen: soft, nontender, nondistended, no obvious ascites, no peritoneal signs, normal bowel sounds. No organomegaly. Extremities: No edema Psychiatric: alert and oriented x3.  Cooperative     ASSESSMENT:  Personal history of adenomatous colon polyps   PLAN:   Surveillance colonoscopy

## 2023-03-13 NOTE — Progress Notes (Signed)
Called to room to assist during endoscopic procedure.  Patient ID and intended procedure confirmed with present staff. Received instructions for my participation in the procedure from the performing physician.  

## 2023-03-16 ENCOUNTER — Telehealth: Payer: Self-pay | Admitting: *Deleted

## 2023-03-16 NOTE — Telephone Encounter (Signed)
  Follow up Call-     03/13/2023    8:57 AM  Call back number  Post procedure Call Back phone  # [phone]  Permission to leave phone message Yes     Patient questions:  Do you have a fever, pain , or abdominal swelling? No. Pain Score  0 *  Have you tolerated food without any problems? Yes.    Have you been able to return to your normal activities? Yes.    Do you have any questions about your discharge instructions: Diet   No. Medications  No. Follow up visit  No.  Do you have questions or concerns about your Care? No.  Actions: * If pain score is 4 or above: No action needed, pain <4.

## 2023-03-17 ENCOUNTER — Encounter: Payer: Self-pay | Admitting: Internal Medicine

## 2023-10-06 ENCOUNTER — Ambulatory Visit (INDEPENDENT_AMBULATORY_CARE_PROVIDER_SITE_OTHER): Payer: BC Managed Care – PPO | Admitting: Podiatry

## 2023-10-06 DIAGNOSIS — Q828 Other specified congenital malformations of skin: Secondary | ICD-10-CM | POA: Diagnosis not present

## 2023-10-06 MED ORDER — UREA 10 % EX CREA: TOPICAL_CREAM | CUTANEOUS | 0 refills | Status: DC | PRN

## 2023-10-06 NOTE — Progress Notes (Signed)
  Subjective:  Patient ID: Jonathan Knox, male    DOB: 10/04/1966,  MRN: [id number]  Chief Complaint  Patient presents with   Foot Problem    Two corns on right foot. Has tried all available otc meds with no resolution of symptoms.     57 y.o. male presents with concern for painful lesions x 2 in the right plantar forefoot.  He has tried OTC corn callus pads that are acid and irritated the skin.  Still having some pain however.  Past Medical History:  Diagnosis Date   Arthritis    [facility]- hip bilateral   Chicken pox    Gilbert's syndrome    Hepatitis    junior in HS. Jaundice mild. was told no further workup needed. ? Hep A. self resolved   Hyperlipidemia    no med-diet controlled   Kidney stone    x 2 - passed stone, no surgery required   Seasonal allergies     No Known Allergies  ROS: Negative except as per HPI above  Objective:  General: AAO x3, NAD  Dermatological: Attention to the right plantar forefoot there is 2 areas of hyperkeratotic tissue with maceration from prior treatment.  Upon debridement of some of the overlying hyperkeratotic macerated tissue there is a central hyperkeratotic core.  Pain on palpation lesions.  Vascular:  Dorsalis Pedis artery and Posterior Tibial artery pedal pulses are 2/4 bilateral.  Capillary fill time < 3 sec to all digits.   Neruologic: Grossly intact via light touch bilateral. Protective threshold intact to all sites bilateral.   Musculoskeletal: No gross boney pedal deformities bilateral. No pain, crepitus, or limitation noted with foot and ankle range of motion bilateral. Muscular strength 5/5 in all groups tested bilateral.  Gait: Unassisted, Nonantalgic.   No images are attached to the encounter.   Assessment:   1. Porokeratosis      Plan:  Patient was evaluated and treated and all questions answered.  # Porokeratosis right foot -All symptomatic hyperkeratoses  x 2 separate lesions were safely debrided  with a sterile #15 blade to patient's level of comfort without incident. We discussed preventative and palliative care of these lesions including supportive and accommodative shoegear, padding, prefabricated and custom molded accommodative orthoses, use of a pumice stone and lotions/creams daily. -eRx for urea cream to the right foot  Return if symptoms worsen or fail to improve.          Corinna Gab, DPM [facility] / [facility]

## 2023-10-18 ENCOUNTER — Other Ambulatory Visit: Payer: Self-pay | Admitting: Medical Genetics

## 2023-10-18 DIAGNOSIS — Z006 Encounter for examination for normal comparison and control in clinical research program: Secondary | ICD-10-CM

## 2023-10-19 ENCOUNTER — Other Ambulatory Visit (HOSPITAL_COMMUNITY)
Admission: RE | Admit: 2023-10-19 | Discharge: 2023-10-19 | Disposition: A | Discharge status: Home / Self Care | Payer: 59 | Source: Ambulatory Visit | Attending: Oncology | Admitting: Oncology

## 2023-10-19 DIAGNOSIS — Z006 Encounter for examination for normal comparison and control in clinical research program: Secondary | ICD-10-CM | POA: Insufficient documentation

## 2023-10-29 ENCOUNTER — Encounter: Payer: Self-pay | Admitting: Podiatry

## 2023-10-29 ENCOUNTER — Ambulatory Visit (INDEPENDENT_AMBULATORY_CARE_PROVIDER_SITE_OTHER): Payer: 59 | Admitting: Podiatry

## 2023-10-29 VITALS — Ht 74.0 in | Wt 251.0 lb

## 2023-10-29 DIAGNOSIS — B070 Plantar wart: Secondary | ICD-10-CM

## 2023-10-29 MED ORDER — FLUOROURACIL 5 % EX CREA: TOPICAL_CREAM | Freq: Two times a day (BID) | CUTANEOUS | 2 refills | Status: DC

## 2023-10-29 NOTE — Progress Notes (Signed)
Subjective:   Patient ID: Jonathan Knox, male   DOB: 57 y.o.   MRN: [id number]   HPI Patient states she has had 2 lesions that have popped up on the bottom of the right foot that are very painful and hard to walk on and its only been around a month and the debridement from 3 weeks ago was not effective   ROS      Objective:  Physical Exam  Neurovascular status intact with upon debridement pinpoint bleeding with several prominent lesions within the right forefoot measuring about 5 x 5 mm pain to lateral pressure     Assessment:  Probability that this is verruca plantaris right given its recent duration and pain     Plan:  Sterile debridement accomplished I went ahead and applied chemical agent to create immune response with sterile dressing explained what to do if blistering were to occur and placed on Efudex with instructions at this time

## 2023-11-03 LAB — GENECONNECT MOLECULAR SCREEN: Genetic Analysis Overall Interpretation: NEGATIVE

## 2023-11-12 ENCOUNTER — Encounter: Payer: Self-pay | Admitting: Podiatry

## 2023-11-12 ENCOUNTER — Ambulatory Visit (INDEPENDENT_AMBULATORY_CARE_PROVIDER_SITE_OTHER): Payer: 59 | Admitting: Podiatry

## 2023-11-12 DIAGNOSIS — B070 Plantar wart: Secondary | ICD-10-CM

## 2023-11-12 NOTE — Patient Instructions (Signed)

## 2023-11-13 NOTE — Progress Notes (Signed)
Subjective:   Patient ID: Jonathan Knox, male   DOB: 57 y.o.   MRN: [id number]   HPI Patient presents with large mass plantar aspect right distal fourth metatarsal that did not respond well to the topical conservative treatment done last month   ROS      Objective:  Physical Exam  Neuro vascular status intact with keratotic lesion that upon debridement shows pinpoint bleeding and pain measuring about 1.2 cm x 1.1 cm     Assessment:  Verruca plantaris right very painful     Plan:  H&P reviewed due to the intensity organ to go ahead and remove this permanently.  Patient wants to have this done understands that even with this it could recur and that we [facility] to do pathology.  I anesthetized 60 mg Xylocaine with epinephrine and Marcaine into the right forefoot I then did sterile prep of the area and using sharp technique I excised this at the epidermal dermal junction and it appears to be verruca.  I applied a small amount of phenol to hopefully kill any remaining cells to the plantar surface applied sterile dressing with compression advised on elevation and to begin soaks in the next couple days and will be seen back as needed and I will wait for the results of pathology

## 2023-11-19 ENCOUNTER — Ambulatory Visit: Payer: 59 | Admitting: Podiatry

## 2023-12-04 ENCOUNTER — Other Ambulatory Visit: Payer: Self-pay | Admitting: Podiatry

## 2024-01-25 ENCOUNTER — Ambulatory Visit (INDEPENDENT_AMBULATORY_CARE_PROVIDER_SITE_OTHER): Payer: 59 | Admitting: Family Medicine

## 2024-01-25 ENCOUNTER — Encounter: Payer: Self-pay | Admitting: Family Medicine

## 2024-01-25 VITALS — BP 108/80 | HR 80 | Temp 97.7°F | Ht 74.0 in | Wt 246.0 lb

## 2024-01-25 DIAGNOSIS — R739 Hyperglycemia, unspecified: Secondary | ICD-10-CM

## 2024-01-25 DIAGNOSIS — Z125 Encounter for screening for malignant neoplasm of prostate: Secondary | ICD-10-CM

## 2024-01-25 DIAGNOSIS — Z131 Encounter for screening for diabetes mellitus: Secondary | ICD-10-CM | POA: Diagnosis not present

## 2024-01-25 DIAGNOSIS — Z0000 Encounter for general adult medical examination without abnormal findings: Secondary | ICD-10-CM | POA: Diagnosis not present

## 2024-01-25 DIAGNOSIS — E785 Hyperlipidemia, unspecified: Secondary | ICD-10-CM

## 2024-01-25 LAB — COMPREHENSIVE METABOLIC PANEL
ALT: 21 U/L (ref 0–53)
AST: 16 U/L (ref 0–37)
Albumin: 4.6 g/dL (ref 3.5–5.2)
Alkaline Phosphatase: 61 U/L (ref 39–117)
BUN: 15 mg/dL (ref 6–23)
CO2: 25 meq/L (ref 19–32)
Calcium: 9.6 mg/dL (ref 8.4–10.5)
Chloride: 105 meq/L (ref 96–112)
Creatinine, Ser: 1.01 mg/dL (ref 0.40–1.50)
GFR: 83.3 mL/min (ref >60.00)
Glucose, Bld: 87 mg/dL (ref 70–99)
Potassium: 4.7 meq/L (ref 3.5–5.1)
Sodium: 141 meq/L (ref 135–145)
Total Bilirubin: 1.7 mg/dL — ABNORMAL HIGH (ref 0.2–1.2)
Total Protein: 7 g/dL (ref 6.0–8.3)

## 2024-01-25 LAB — CBC WITH DIFFERENTIAL/PLATELET
Basophils Absolute: 0 10*3/uL (ref 0.0–0.1)
Basophils Relative: 0.4 % (ref 0.0–3.0)
Eosinophils Absolute: 0.2 10*3/uL (ref 0.0–0.7)
Eosinophils Relative: 2.2 % (ref 0.0–5.0)
HCT: 49.3 % (ref 39.0–52.0)
Hemoglobin: 16.8 g/dL (ref 13.0–17.0)
Lymphocytes Relative: 23.5 % (ref 12.0–46.0)
Lymphs Abs: 1.8 10*3/uL (ref 0.7–4.0)
MCHC: 34 g/dL (ref 30.0–36.0)
MCV: 90.5 fl (ref 78.0–100.0)
Monocytes Absolute: 0.6 10*3/uL (ref 0.1–1.0)
Monocytes Relative: 8.3 % (ref 3.0–12.0)
Neutro Abs: 5.1 10*3/uL (ref 1.4–7.7)
Neutrophils Relative %: 65.6 % (ref 43.0–77.0)
Platelets: 180 10*3/uL (ref 150.0–400.0)
RBC: 5.44 Mil/uL (ref 4.22–5.81)
RDW: 13.5 % (ref 11.5–15.5)
WBC: 7.8 10*3/uL (ref 4.0–10.5)

## 2024-01-25 LAB — LIPID PANEL
Cholesterol: 205 mg/dL — ABNORMAL HIGH (ref 0–200)
HDL: 51.6 mg/dL (ref >39.00)
LDL Cholesterol: 138 mg/dL — ABNORMAL HIGH (ref 0–99)
NonHDL: 153.75
Total CHOL/HDL Ratio: 4
Triglycerides: 77 mg/dL (ref 0.0–149.0)
VLDL: 15.4 mg/dL (ref 0.0–40.0)

## 2024-01-25 LAB — PSA: PSA: 0.78 ng/mL (ref 0.10–4.00)

## 2024-01-25 LAB — HEMOGLOBIN A1C: Hgb A1c MFr Bld: 5.3 % (ref 4.6–6.5)

## 2024-01-25 NOTE — Progress Notes (Signed)
 Phone: [phone]   Subjective:  Patient presents today for their annual physical. Chief complaint-noted.   See problem oriented charting- ROS- full  review of systems was completed and negative  Per full ROS sheet completed by patient- very sparing sharp pains in eye- appears to have burst vessel in sclera as of Friday as well- no pain with that and no blurry vision- has eye doctor visit on friday  The following were reviewed and entered/updated in epic: Past Medical History:  Diagnosis Date   Allergy    Seasonal   Arthritis    [facility]- hip bilateral   Chicken pox    Gilbert's syndrome    Hepatitis    junior in HS. Jaundice mild. was told no further workup needed. ? Hep A. self resolved   Hyperlipidemia    no med-diet controlled   Kidney stone    x 2 - passed stone, no surgery required   Seasonal allergies    Patient Active Problem List   Diagnosis Date Noted   History of adenomatous polyp of colon 03/06/2018    Priority: Medium    Hyperglycemia 12/23/2016    Priority: Medium    Hyperlipidemia     Priority: Medium    Gilberts syndrome 12/24/2016    Priority: Low   Kidney stone     Priority: Low   Arthritis     Priority: Low   Hypertrophy of both inferior nasal turbinates 10/24/2022   Seasonal allergic rhinitis due to pollen 10/24/2022   Past Surgical History:  Procedure Laterality Date   COLONOSCOPY     SHOULDER ARTHROSCOPY Right    rotator cuff. Dr. Yisroel Ramming [facility]   WISDOM TOOTH EXTRACTION      Family History  Problem Relation Age of Onset   Other Mother        not very open about her health   Atrial fibrillation Father        has had some vertiog   Healthy Brother        has had some vertigo, mild anxiety   Arthritis Maternal Grandmother    Colon cancer Maternal Grandmother        late life    CAD Maternal Grandfather        "hardening of arteries" "smoked like chimneys"   Hyperlipidemia Paternal Grandmother    Heart disease Paternal  Grandmother        69s   Stroke Paternal Grandmother    Hypertension Paternal Grandmother    Prostate cancer Paternal Grandfather        lived near a plant   Rectal cancer Neg Hx    Stomach cancer Neg Hx    Colon polyps Neg Hx    Esophageal cancer Neg Hx     Medications- reviewed and updated Current Outpatient Medications  Medication Sig Dispense Refill   azelastine (ASTELIN) 0.1 % nasal spray Place into the nose.     EPINEPHrine 0.3 mg/0.3 mL IJ SOAJ injection      fluticasone (FLONASE) 50 MCG/ACT nasal spray Place into the nose.     tamsulosin (FLOMAX) 0.4 MG CAPS capsule Take 0.4 mg by mouth daily.     UNABLE TO FIND Med Name: Allergy Shots 3-4 per month at [facility].     No current facility-administered medications for this visit.    Allergies-reviewed and updated No Known Allergies  Social History   Social History Narrative   Married 1998. 2 daughters ('03, '06). 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    Undergrad- [employer]   Masters- [employer]      Hobbies: play tennis, hiking, working on Producer, television/film/video to play guitar   Objective  Objective:  BP 108/80   Pulse 80   Temp 97.7 F (36.5 C)   Ht 6\' 2"  (1.88 m)   Wt 246 lb (111.6 kg)   SpO2 99%   BMI 31.58 kg/m  Gen: NAD, resting comfortably HEENT: Mucous membranes are moist. Oropharynx normal Neck: no thyromegaly CV: RRR no murmurs rubs or gallops Lungs: CTAB no crackles, wheeze, rhonchi Abdomen: soft/nontender/nondistended/normal bowel sounds. No rebound or guarding.  Ext: no edema Skin: warm, dry Neuro: grossly normal, moves all extremities, PERRLA Declines genitourinary and rectal    Assessment and Plan  58 y.o. male presenting for annual physical.  Health Maintenance counseling: 1. Anticipatory guidance:  Patient counseled regarding regular dental exams -q6 months, eye exams --yearly with Dr. Burgess Estelle sees friday,  avoiding smoking and second hand smoke , limiting alcohol to 2 beverages per day-about 2 a month, no illicit drugs .   2. Risk factor reduction:  Advised patient of need for regular exercise and diet rich and fruits and vegetables to reduce risk of heart attack and stroke.  Exercise-last year placed a setback after loss of his dog- walking intentionally 20-25 minutes a day plus a lot more steps outside of that and using actual stairs more- mentioned rucking- stop if any issues.  Diet/weight management-weight down 1 pound in the last year- eating good foods and feels just needs to cut portions. Could cut sweet tooth. Would like to get to around 220 at least.  Wt Readings from Last 3 Encounters:  01/25/24 246 lb (111.6 kg)  10/29/23 251 lb (113.9 kg)  03/13/23 251 lb 3.2 oz (113.9 kg)  3. Immunizations/screenings/ancillary studies-fully up-to-date  Immunization History  Administered Date(s) Administered   Influenza, High Dose Seasonal PF 10/02/2016, 09/24/2017, 09/29/2018, 10/09/2020   Influenza-Unspecified 09/10/2016, 09/02/2017, 08/31/2018, 09/05/2019, 08/24/2020, 09/25/2023   Janssen (J&J) SARS-COV-2 Vaccination 02/03/2020   Moderna Covid-19 Fall Seasonal Vaccine 57yrs & older 09/07/2022, 09/25/2023   Moderna Sars-Covid-2 Vaccination 09/20/2020, 10/09/2020   Td 12/05/2015   Tdap 03/09/2006   Zoster Recombinant(Shingrix) 12/30/2018, 04/12/2019   4. Prostate cancer screening- low risk prior trend- update psa today   Lab Results  Component Value Date   PSA 0.45 01/20/2023   5. Colon cancer screening - History of colon polyps April 2024 with 7-year repeat with Dr. Marina Goodell 6. Skin cancer screening-sees Dr. Yetta Barre with dermatology- history of squamous cell removal in the fall. advised regular sunscreen use. Denies worrisome, changing, or new skin lesions.  7. Smoking associated screening  (lung cancer screening, AAA screen 65-75, UA)-never smoker 8. STD screening - only active with wife so not needed  Status of chronic or acute concerns   #hyperlipidemia w elevated high sensitivity CRP S: Medication: None The 10-year ASCVD risk score (Arnett DK, et al., 2019) is: 4.9% A/P: lipids mildly improved last year- update today- continue to work on lifestyle   # Hyperglycemia/insulin resistance/prediabetes-A1c's have not been elevated but CBGs have been in the past-A1c max 5.3 S:  Medication: none A/P: sugars high at times but a1cs ok- check today   #Gilbert's syndrome-high bilirubin at baseline-trend LFTs    #Allergies-followed  with Dr. Gary Fleet group still on twice a month immunotherapy.  Has EpiPen available if needed due to allergy shots  -also on astelin, flonase  # attention concerns- that lead to anxiety issues- still doing better with meditation and breathing etc.   #Mouth breathing- saw Dr. Ernestene Kiel with ENT through [facility]- trying nasal sprays for edema before considering surgery- better but wanting to hold off on surgery  Recommended follow up: Return in about 1 year (around 01/24/2025) for physical or sooner if needed.Schedule b4 you leave.  Lab/Order associations: fasting   ICD-10-CM   1. Preventative health care  Z00.00     2. Screening for prostate cancer  Z12.5 PSA    3. Screening for diabetes mellitus  Z13.1 Hemoglobin A1c    4. Hyperglycemia  R73.9 Hemoglobin A1c    5. Hyperlipidemia, unspecified hyperlipidemia type  E78.5 Comprehensive metabolic panel    CBC with Differential/Platelet    Lipid panel      No orders of the defined types were placed in this encounter.   Return precautions advised.  Tana Conch, MD

## 2024-01-25 NOTE — Patient Instructions (Addendum)
 I suggest myfitnesspal Use 0.5 pounds per week weight loss goal (or up to 1 pounds if needed) Set a reasonable goal such as 5-10 lbs and can reset goal once you reach the goal Do not connect your step counter to this- watch or phone Update me in 2-3 months with how you are doing  Please stop by lab before you go If you have mychart- we will send your results within 3 business days of [place] receiving them.  If you do not have mychart- we will call you about results within 5 business days of [place] receiving them.  *please also note that you will see labs on mychart as soon as they post. I will later go in and write notes on them- will say "notes from Dr. Durene Cal"   Recommended follow up: Return in about 1 year (around 01/24/2025) for physical or sooner if needed.Schedule b4 you leave.

## 2024-06-22 ENCOUNTER — Other Ambulatory Visit: Payer: Self-pay | Admitting: Urology

## 2024-06-23 MED ORDER — ONABOTULINUMTOXINA 100 UNITS IJ SOLR: 100.0000 [IU] | Freq: Once | INTRAMUSCULAR | Status: AC

## 2024-07-06 NOTE — Patient Instructions (Signed)
 SURGICAL WAITING ROOM VISITATION Patients having surgery or a procedure may have no more than 2 support people in the waiting area - these visitors may rotate in the visitor waiting room.   Due to an increase in RSV and influenza rates and associated hospitalizations, children ages 75 and under may not visit patients in [facility] hospitals. If the patient needs to stay at the hospital during part of their recovery, the visitor guidelines for inpatient rooms apply.  PRE-OP VISITATION  Pre-op nurse will coordinate an appropriate time for 1 support person to accompany the patient in pre-op.  This support person may not rotate.  This visitor will be contacted when the time is appropriate for the visitor to come back in the pre-op area.  Please refer to the [facility] website for the visitor guidelines for Inpatients (after your surgery is over and you are in a regular room).  You are not required to quarantine at this time prior to your surgery. However, you must do this: Hand Hygiene often Do NOT share personal items Notify your provider if you are in close contact with someone who has COVID or you develop fever 100.4 or greater, new onset of sneezing, cough, sore throat, shortness of breath or body aches.  If you test positive for Covid or have been in contact with anyone that has tested positive in the last 10 days please notify you surgeon.    Your procedure is scheduled on:  07/19/24  Report to [facility] Main Entrance: [address] entrance where the [employer] is available.   Report to admitting at:8:45 AM  Call this number if you have any questions or problems the morning of surgery [phone]  FOLLOW ANY ADDITIONAL PRE OP INSTRUCTIONS YOU RECEIVED FROM YOUR SURGEON'S OFFICE!!!  Do not eat food or drink fluids after Midnight the night prior to your surgery/procedure.   Oral Hygiene is also important to reduce your risk of infection.        Remember - BRUSH YOUR TEETH  THE MORNING OF SURGERY WITH YOUR REGULAR TOOTHPASTE  Do NOT smoke after Midnight the night before surgery.  STOP TAKING all Vitamins, Herbs and supplements 1 week before your surgery.   Take ONLY these medicines the morning of surgery with A SIP OF WATER: NONE.  If You have been diagnosed with Sleep Apnea - Bring CPAP mask and tubing day of surgery. We will provide you with a CPAP machine on the day of your surgery.                   You may not have any metal on your body including hair pins, jewelry, and body piercing  Do not wear lotions, powders, perfumes / cologne, or deodorant  Men may shave face and neck.  Contacts, Hearing Aids, dentures or bridgework may not be worn into surgery. DENTURES WILL BE REMOVED PRIOR TO SURGERY PLEASE DO NOT APPLY Poly grip OR ADHESIVES!!!  You may bring a small overnight bag with you on the day of surgery, only pack items that are not valuable. [facility] IS NOT RESPONSIBLE   FOR VALUABLES THAT ARE LOST OR STOLEN.   Patients discharged on the day of surgery will not be allowed to drive home.  Someone NEEDS to stay with you for the first 24 hours after anesthesia.  Do not bring your home medications to the hospital. The Pharmacy will dispense medications listed on your medication list to you during your admission in the Hospital.  Special Instructions: Bring a copy of your healthcare power of attorney and living will documents the day of surgery, if you wish to have them scanned into your [facility] Medical Records- EPIC  Please read over the following fact sheets you were given: IF YOU HAVE QUESTIONS ABOUT YOUR PRE-OP INSTRUCTIONS, PLEASE CALL [phone]   [facility] - Preparing for Surgery Before surgery, you can play an important role.  Because skin is not sterile, your skin needs to be as free of germs as possible.  You can reduce the number of germs on your skin by washing with CHG (chlorahexidine gluconate) soap before surgery.  CHG is an  antiseptic cleaner which kills germs and bonds with the skin to continue killing germs even after washing. Please DO NOT use if you have an allergy to CHG or antibacterial soaps.  If your skin becomes reddened/irritated stop using the CHG and inform your nurse when you arrive at Short Stay. Do not shave (including legs and underarms) for at least 48 hours prior to the first CHG shower.  You may shave your face/neck.  Please follow these instructions carefully:  1.  Shower with CHG Soap the night before surgery and the  morning of surgery.  2.  If you choose to wash your hair, wash your hair first as usual with your normal  shampoo.  3.  After you shampoo, rinse your hair and body thoroughly to remove the shampoo.                             4.  Use CHG as you would any other liquid soap.  You can apply chg directly to the skin and wash.  Gently with a scrungie or clean washcloth.  5.  Apply the CHG Soap to your body ONLY FROM THE NECK DOWN.   Do not use on face/ open                           Wound or open sores. Avoid contact with eyes, ears mouth and genitals (private parts).                       Wash face,  Genitals (private parts) with your normal soap.             6.  Wash thoroughly, paying special attention to the area where your  surgery  will be performed.  7.  Thoroughly rinse your body with warm water from the neck down.  8.  DO NOT shower/wash with your normal soap after using and rinsing off the CHG Soap.            9.  Pat yourself dry with a clean towel.            10.  Wear clean pajamas.            11.  Place clean sheets on your bed the night of your first shower and do not  sleep with pets.  ON THE DAY OF SURGERY : Do not apply any lotions/deodorants the morning of surgery.  Please wear clean clothes to the hospital/surgery center.     FAILURE TO FOLLOW THESE INSTRUCTIONS MAY RESULT IN THE CANCELLATION OF YOUR SURGERY  PATIENT  SIGNATURE_________________________________  NURSE SIGNATURE__________________________________  ________________________________________________________________________

## 2024-07-07 ENCOUNTER — Encounter (HOSPITAL_COMMUNITY)
Admission: RE | Admit: 2024-07-07 | Discharge: 2024-07-07 | Disposition: A | Discharge status: Home / Self Care | Source: Ambulatory Visit | Attending: Surgery | Admitting: Surgery

## 2024-07-07 ENCOUNTER — Encounter (HOSPITAL_COMMUNITY): Payer: Self-pay

## 2024-07-07 ENCOUNTER — Other Ambulatory Visit: Payer: Self-pay

## 2024-07-07 VITALS — BP 141/85 | HR 84 | Temp 98.4°F | Ht 74.0 in | Wt 247.0 lb

## 2024-07-07 DIAGNOSIS — Z01812 Encounter for preprocedural laboratory examination: Secondary | ICD-10-CM | POA: Diagnosis present

## 2024-07-07 DIAGNOSIS — Z01818 Encounter for other preprocedural examination: Secondary | ICD-10-CM

## 2024-07-07 HISTORY — DX: Malignant (primary) neoplasm, unspecified: C80.1

## 2024-07-07 HISTORY — DX: Personal history of urinary calculi: Z87.442

## 2024-07-07 HISTORY — DX: Other specified postprocedural states: Z98.890

## 2024-07-07 HISTORY — DX: Other complications of anesthesia, initial encounter: T88.59XA

## 2024-07-07 LAB — CBC
HCT: 47.2 % (ref 39.0–52.0)
Hemoglobin: 16.2 g/dL (ref 13.0–17.0)
MCH: 30.5 pg (ref 26.0–34.0)
MCHC: 34.3 g/dL (ref 30.0–36.0)
MCV: 88.7 fL (ref 80.0–100.0)
Platelets: 203 K/uL (ref 150–400)
RBC: 5.32 MIL/uL (ref 4.22–5.81)
RDW: 12.7 % (ref 11.5–15.5)
WBC: 7.4 K/uL (ref 4.0–10.5)
nRBC: 0 % (ref 0.0–0.2)

## 2024-07-07 NOTE — H&P (View-Only) (Signed)
 For Anesthesia: PCP - Katrinka Garnette KIDD, MD  Cardiologist - N/A  Bowel Prep reminder:  Chest x-ray -  EKG -  Stress Test -  ECHO -  Cardiac Cath -  Pacemaker/ICD device last checked: Pacemaker orders received: Device Rep notified:  Spinal Cord Stimulator:N/A  Sleep Study -  CPAP -   Fasting Blood Sugar - N/A Checks Blood Sugar _____ times a day Date and result of last Hgb A1c-  Last dose of GLP1 agonist- N/A GLP1 instructions:   Last dose of SGLT-2 inhibitors- N/A SGLT-2 instructions:   Blood Thinner Instructions:N/A Aspirin Instructions: Last Dose:  Activity level: Can go up a flight of stairs and activities of daily living without stopping and without chest pain and/or shortness of breath   Able to exercise without chest pain and/or shortness of breath  Anesthesia review:   Patient denies shortness of breath, fever, cough and chest pain at PAT appointment   Patient verbalized understanding of instructions that were reviewed over the telephone.

## 2024-07-07 NOTE — Progress Notes (Signed)
 For Anesthesia: PCP - Katrinka Garnette KIDD, MD  Cardiologist - N/A  Bowel Prep reminder:  Chest x-ray -  EKG -  Stress Test -  ECHO -  Cardiac Cath -  Pacemaker/ICD device last checked: Pacemaker orders received: Device Rep notified:  Spinal Cord Stimulator:N/A  Sleep Study -  CPAP -   Fasting Blood Sugar - N/A Checks Blood Sugar _____ times a day Date and result of last Hgb A1c-  Last dose of GLP1 agonist- N/A GLP1 instructions:   Last dose of SGLT-2 inhibitors- N/A SGLT-2 instructions:   Blood Thinner Instructions:N/A Aspirin Instructions: Last Dose:  Activity level: Can go up a flight of stairs and activities of daily living without stopping and without chest pain and/or shortness of breath   Able to exercise without chest pain and/or shortness of breath  Anesthesia review:   Patient denies shortness of breath, fever, cough and chest pain at PAT appointment   Patient verbalized understanding of instructions that were reviewed over the telephone.

## 2024-07-19 ENCOUNTER — Encounter (HOSPITAL_COMMUNITY)
Admission: RE | Disposition: A | Discharge status: Home / Self Care | Payer: Self-pay | Source: Ambulatory Visit | Attending: Urology

## 2024-07-19 ENCOUNTER — Ambulatory Visit (HOSPITAL_COMMUNITY)
Admission: RE | Admit: 2024-07-19 | Discharge: 2024-07-19 | Disposition: A | Discharge status: Home / Self Care | Source: Ambulatory Visit | Attending: Urology | Admitting: Urology

## 2024-07-19 ENCOUNTER — Encounter (HOSPITAL_COMMUNITY): Payer: Self-pay | Admitting: Urology

## 2024-07-19 ENCOUNTER — Other Ambulatory Visit: Payer: Self-pay

## 2024-07-19 ENCOUNTER — Ambulatory Visit (HOSPITAL_BASED_OUTPATIENT_CLINIC_OR_DEPARTMENT_OTHER): Admitting: Anesthesiology

## 2024-07-19 ENCOUNTER — Ambulatory Visit (HOSPITAL_COMMUNITY): Payer: Self-pay | Admitting: Medical

## 2024-07-19 DIAGNOSIS — N3281 Overactive bladder: Secondary | ICD-10-CM | POA: Diagnosis not present

## 2024-07-19 DIAGNOSIS — R350 Frequency of micturition: Secondary | ICD-10-CM | POA: Diagnosis present

## 2024-07-19 DIAGNOSIS — R3915 Urgency of urination: Secondary | ICD-10-CM

## 2024-07-19 DIAGNOSIS — N401 Enlarged prostate with lower urinary tract symptoms: Secondary | ICD-10-CM | POA: Diagnosis not present

## 2024-07-19 DIAGNOSIS — E785 Hyperlipidemia, unspecified: Secondary | ICD-10-CM | POA: Diagnosis not present

## 2024-07-19 DIAGNOSIS — Z79899 Other long term (current) drug therapy: Secondary | ICD-10-CM | POA: Insufficient documentation

## 2024-07-19 DIAGNOSIS — Z8042 Family history of malignant neoplasm of prostate: Secondary | ICD-10-CM | POA: Diagnosis not present

## 2024-07-19 HISTORY — PX: CYSTOSCOPY WITH INJECTION: SHX1424

## 2024-07-19 SURGERY — CYSTOSCOPY, WITH INJECTION OF BLADDER NECK OR BLADDER WALL
Anesthesia: Monitor Anesthesia Care | Site: Bladder

## 2024-07-19 MED ORDER — ONDANSETRON HCL 4 MG/2ML IJ SOLN
INTRAMUSCULAR | Status: AC
  Filled 2024-07-19: qty 2

## 2024-07-19 MED ORDER — PROPOFOL 1000 MG/100ML IV EMUL
INTRAVENOUS | Status: AC
  Filled 2024-07-19: qty 100

## 2024-07-19 MED ORDER — ONABOTULINUMTOXINA 100 UNITS IJ SOLR
INTRAMUSCULAR | Status: AC
  Filled 2024-07-19: qty 200

## 2024-07-19 MED ORDER — STERILE WATER FOR IRRIGATION IR SOLN
Status: DC | PRN
Start: 2024-07-19 — End: 2024-07-19
  Administered 2024-07-19: 3000 mL

## 2024-07-19 MED ORDER — PROPOFOL 500 MG/50ML IV EMUL
INTRAVENOUS | Status: DC | PRN
  Administered 2024-07-19 (×2): 50 mg via INTRAVENOUS
  Administered 2024-07-19: 100 ug/kg/min via INTRAVENOUS

## 2024-07-19 MED ORDER — ORAL CARE MOUTH RINSE: 15.0000 mL | Freq: Once | OROMUCOSAL | Status: AC

## 2024-07-19 MED ORDER — OXYCODONE HCL 5 MG PO TABS: 5.0000 mg | ORAL_TABLET | Freq: Once | ORAL | Status: DC | PRN

## 2024-07-19 MED ORDER — FENTANYL CITRATE (PF) 100 MCG/2ML IJ SOLN
INTRAMUSCULAR | Status: DC | PRN
  Administered 2024-07-19: 50 ug via INTRAVENOUS

## 2024-07-19 MED ORDER — FENTANYL CITRATE (PF) 100 MCG/2ML IJ SOLN
INTRAMUSCULAR | Status: AC
  Filled 2024-07-19: qty 2

## 2024-07-19 MED ORDER — GLYCOPYRROLATE 0.2 MG/ML IJ SOLN
INTRAMUSCULAR | Status: DC | PRN
  Administered 2024-07-19: .2 mg via INTRAVENOUS

## 2024-07-19 MED ORDER — HYDROMORPHONE HCL 1 MG/ML IJ SOLN: 0.2500 mg | INTRAMUSCULAR | Status: DC | PRN

## 2024-07-19 MED ORDER — LACTATED RINGERS IV SOLN: INTRAVENOUS | Status: DC

## 2024-07-19 MED ORDER — CHLORHEXIDINE GLUCONATE 0.12 % MT SOLN
15.0000 mL | Freq: Once | OROMUCOSAL | Status: AC
  Administered 2024-07-19: 15 mL via OROMUCOSAL

## 2024-07-19 MED ORDER — SODIUM CHLORIDE 0.9 % IV SOLN: 12.5000 mg | INTRAVENOUS | Status: DC | PRN

## 2024-07-19 MED ORDER — OXYCODONE HCL 5 MG/5ML PO SOLN: 5.0000 mg | Freq: Once | ORAL | Status: DC | PRN

## 2024-07-19 MED ORDER — MIDAZOLAM HCL 2 MG/2ML IJ SOLN
INTRAMUSCULAR | Status: AC
  Filled 2024-07-19: qty 2

## 2024-07-19 MED ORDER — ONDANSETRON HCL 4 MG/2ML IJ SOLN
INTRAMUSCULAR | Status: DC | PRN
  Administered 2024-07-19: 4 mg via INTRAVENOUS

## 2024-07-19 MED ORDER — CEFAZOLIN SODIUM-DEXTROSE 2-4 GM/100ML-% IV SOLN
2.0000 g | INTRAVENOUS | Status: AC
  Administered 2024-07-19: 2 g via INTRAVENOUS
  Filled 2024-07-19: qty 100

## 2024-07-19 MED ORDER — MIDAZOLAM HCL 5 MG/5ML IJ SOLN
INTRAMUSCULAR | Status: DC | PRN
Start: 2024-07-19 — End: 2024-07-19
  Administered 2024-07-19: 2 mg via INTRAVENOUS

## 2024-07-19 MED ORDER — PROPOFOL 10 MG/ML IV BOLUS
INTRAVENOUS | Status: AC
  Filled 2024-07-19: qty 20

## 2024-07-19 MED ORDER — SODIUM CHLORIDE (PF) 0.9 % IJ SOLN
INTRAMUSCULAR | Status: DC | PRN
  Administered 2024-07-19: 10 mL

## 2024-07-19 MED ORDER — ONABOTULINUMTOXINA 100 UNITS IJ SOLR
INTRAMUSCULAR | Status: DC | PRN
  Administered 2024-07-19: 100 [IU] via INTRAMUSCULAR

## 2024-07-19 MED ORDER — GLYCOPYRROLATE 0.2 MG/ML IJ SOLN
INTRAMUSCULAR | Status: AC
  Filled 2024-07-19: qty 1

## 2024-07-19 MED ORDER — SODIUM CHLORIDE (PF) 0.9 % IJ SOLN
INTRAMUSCULAR | Status: AC
  Filled 2024-07-19: qty 20

## 2024-07-19 SURGICAL SUPPLY — 14 items
BAG URO CATCHER STRL LF (MISCELLANEOUS) ×2 IMPLANT
CLOTH BEACON ORANGE TIMEOUT ST (SAFETY) ×2 IMPLANT
GLOVE SURG LX STRL 8.0 MICRO (GLOVE) ×2 IMPLANT
GOWN STRL REUS W/ TWL XL LVL3 (GOWN DISPOSABLE) ×4 IMPLANT
KIT TURNOVER KIT A (KITS) ×2 IMPLANT
MANIFOLD NEPTUNE II (INSTRUMENTS) ×2 IMPLANT
NDL ASPIRATION 22 (NEEDLE) ×2 IMPLANT
NDL SAFETY ECLIPSE 18X1.5 (NEEDLE) IMPLANT
NEEDLE ASPIRATION 22 (NEEDLE) ×1 IMPLANT
PACK CYSTO (CUSTOM PROCEDURE TRAY) ×2 IMPLANT
PENCIL SMOKE EVACUATOR (MISCELLANEOUS) IMPLANT
SYR CONTROL 10ML LL (SYRINGE) IMPLANT
TUBING CONNECTING 10 (TUBING) IMPLANT
WATER STERILE IRR 3000ML UROMA (IV SOLUTION) ×2 IMPLANT

## 2024-07-19 NOTE — Anesthesia Preprocedure Evaluation (Signed)
 Anesthesia Evaluation  Patient identified by MRN, date of birth, ID band Patient awake    Reviewed: Allergy & Precautions, H&P , NPO status , Patient's Chart, lab work & pertinent test results  History of Anesthesia Complications (+) PONV and history of anesthetic complications  Airway Mallampati: II  TM Distance: >3 FB Neck ROM: Full    Dental no notable dental hx.    Pulmonary neg pulmonary ROS   Pulmonary exam normal breath sounds clear to auscultation       Cardiovascular negative cardio ROS Normal cardiovascular exam Rhythm:Regular Rate:Normal     Neuro/Psych negative neurological ROS  negative psych ROS   GI/Hepatic negative GI ROS, Neg liver ROS,,,  Endo/Other  negative endocrine ROS    Renal/GU negative Renal ROS  negative genitourinary   Musculoskeletal  (+) Arthritis , Osteoarthritis,    Abdominal  (+) + obese  Peds negative pediatric ROS (+)  Hematology negative hematology ROS (+)   Anesthesia Other Findings   Reproductive/Obstetrics negative OB ROS                              Anesthesia Physical Anesthesia Plan  ASA: 2  Anesthesia Plan: MAC   Post-op Pain Management:    Induction: Intravenous  PONV Risk Score and Plan: 2 and Propofol  infusion, Ondansetron  and Treatment may vary due to age or medical condition  Airway Management Planned: Simple Face Mask  Additional Equipment:   Intra-op Plan:   Post-operative Plan:   Informed Consent: I have reviewed the patients History and Physical, chart, labs and discussed the procedure including the risks, benefits and alternatives for the proposed anesthesia with the patient or authorized representative who has indicated his/her understanding and acceptance.     Dental advisory given  Plan Discussed with: CRNA  Anesthesia Plan Comments:         Anesthesia Quick Evaluation

## 2024-07-19 NOTE — Anesthesia Procedure Notes (Signed)
 Procedure Name: MAC Date/Time: 07/19/2024 11:21 AM  Performed by: Nada Corean CROME, CRNAPre-anesthesia Checklist: Patient identified, Emergency Drugs available, Suction available, Timeout performed and Patient being monitored Patient Re-evaluated:Patient Re-evaluated prior to induction Oxygen Delivery Method: Simple face mask Preoxygenation: Pre-oxygenation with 100% oxygen Induction Type: IV induction Placement Confirmation: positive ETCO2 Dental Injury: Teeth and Oropharynx as per pre-operative assessment

## 2024-07-19 NOTE — H&P (Signed)
 Office Visit Report     06/02/2024   --------------------------------------------------------------------------------   Jonathan Knox  MRN: [id number]  DOB: 1966/06/21, 58 year old Male  PRIMARY CARE:  Thresa RONAL Kluver (retired), MD  PRIMARY CARE FAX:  [phone]  REFERRING:  Garnette KIDD. Katrinka MOULD, MD  PROVIDER:  Lonni Han, M.D.  LOCATION:  [facility] [phone]     --------------------------------------------------------------------------------   CC/HPI: CC: Urgency   HPI: Jonathan Knox is a 58 year old male with a history of BPH, nephrolithiasis and a family history of prostate cancer (paternal grandfather).   Last PSA: 0.78 (01/2024), 0.45 (12/2022), 0.23 (02/2022), 0.31 (02/2020), 0.43 (2015)   03/16/20: The patient is here today for a routine follow-up after restarting tamsulosin. He reports moderate improvement in his FOS and urgency, but notes that when he travels that he is focused on making sure he know where restrooms are due to his persistent urgency. Nocturia x 2. Denies interval UTIs, dysuria or hematuria. Overall, he is pleased with is current urinary sxs and deferred additional medical treatment.   03/18/21: The patient is here today for a routine follow-up. Still taking tamsulosin with sustained improvement in his LUTS. Denies interval UTIs, dysuria or hematuria.   03/13/22: The patient is here today for a routine follow-up. His PSA remains well within the normal range. He continues to take tamsulosin once daily and reports stable LUTS. He states that he still voids 10-12 times per day, but isn't bothered by it. Denies interval UTIs, dysuria or hematuria. Doing well.   12/03/23: The patient is here today for a routine follow-up. He continues to take tamsulosin once daily and reports worsening urinary urgency and frequency x 16/day. He notes a good FOS and feels like he is emptying his bladder well. Voiding large volumes with each void. Drinks three  10 oz cups of coffee per day. Drinks alcohol socially 1-2 times per month. Drinking 3-4 16 oz bottles of water  per day. Denies diuretic use. Recent BMP was WNL. Denies interval UTIs, dysuria or hematuria. UA today is clear.   01/18/2024: The patient is here today for routine follow-up. He was started on oxybutynin 10 mg ER and instructed to significantly reduce his caffeine intake at his last visit. Today, he reports slight improvement in his urinary frequency. He states that he is now down to 12-14 times per day, but is still wanting to make improvement. He now admits that he is drinking anywhere between 80 to 100 ounces of water  daily. He has significantly reduced his caffeine intake and is only drinking 1 cup of coffee in the morning. He denies interval UTIs, dysuria or hematuria. He also denies any associated side effects with oxybutynin.   02/29/24: The patient is here today for a routine follow-up. He was started on oxybutynin 10 mg ER in addition to tamsulosin once daily at his last visit. Today, he reports slight improvement in his daytime urgency and is down to voiding 10-12 times per day. He also concurrently reduced his fluid intake. He is still bothered by his urinary frequency and is receptive to trying additional therapies.  PVR-0 mL   05/02/24: The patient is here today for a routine follow-up. He was started on Myrbetriq 50 mg, in addition to tamsulosin 0.4 mg once daily, at his last visit and reports no improvement in his urinary frequency. He continues to void 10-16 times per day, which is understandably bothersome. He denies interval UTIs, dysuria or hematuria. UA today is  clear.   06/02/24: The patient is here today for a routine follow-up. His recent UDS showed an appropriate flow rate, filling and voiding pressures. PVR-0. He continues to struggle with daytime urgency and frequency despite multiple BPH/OAB meds.     ALLERGIES: No Allergies    MEDICATIONS: Myrbetriq 50 MG Tablet  Extended Release 24 Hour 1 tablet PO Daily  Tamsulosin HCl 0.4 MG Capsule TAKE 1 CAPSULE DAILY  Flonase Allergy Relief     GU PSH: Complex cystometrogram, w/ void pressure and urethral pressure profile studies, any technique - 05/20/2024 Complex Uroflow - 05/20/2024 Emg surf Electrd - 05/20/2024 Inject For cystogram - 05/20/2024 Intrabd voidng Press - 05/20/2024     NON-GU PSH: Shoulder Surgery (Unspecified) - about 1999     GU PMH: Urinary Frequency - 05/20/2024, - 05/02/2024, - 02/29/2024, - 01/18/2024, - 12/03/2023, - 2023 BPH w/LUTS - 05/02/2024, - 02/29/2024, - 01/18/2024, - 12/03/2023, - 2023, - 2022, - 2021 Encounter for Prostate Cancer screening - 2023, - 2022, Prostate cancer screening, - 2015 Family Hx of Prostate Cancer - 2022, - 2019 Urinary Urgency - 2022, - 2021 Flank Pain - 2019 History of urolithiasis (Stable) - 2019, - 2019 LLQ pain - 2019 Renal calculus, Nephrolithiasis - 2016 Hydronephrosis Unspec, Hydronephrosis, right - 2015 Ureteral calculus, Calculus of ureter - 2015 Other microscopic hematuria, Microscopic hematuria - 2015    NON-GU PMH: Encounter for general adult medical examination without abnormal findings, Encounter for preventive health examination - 2015 Nausea, Nausea - 2015    FAMILY HISTORY: No pertinent family history - Other   SOCIAL HISTORY: Marital Status: Married Preferred Language: English Current Smoking Status: Patient has never smoked.   Tobacco Use Assessment Completed: Used Tobacco in last 30 days? Drinks 2 drinks per month. Types of alcohol consumed: Wine. Social Drinker.  Drinks 2 caffeinated drinks per day.     Notes: Never A Smoker   REVIEW OF SYSTEMS:    GU Review Male:   Patient denies frequent urination, hard to postpone urination, burning/ pain with urination, get up at night to urinate, leakage of urine, stream starts and stops, trouble starting your stream, have to strain to urinate , erection problems, and penile pain.   Gastrointestinal (Upper):   Patient denies indigestion/ heartburn, vomiting, and nausea.  Gastrointestinal (Lower):   Patient denies diarrhea and constipation.  Constitutional:   Patient denies fever, night sweats, weight loss, and fatigue.  Skin:   Patient denies skin rash/ lesion and itching.  Eyes:   Patient denies blurred vision and double vision.  Ears/ Nose/ Throat:   Patient denies sore throat and sinus problems.  Hematologic/Lymphatic:   Patient denies swollen glands and easy bruising.  Cardiovascular:   Patient denies leg swelling and chest pains.  Respiratory:   Patient denies cough and shortness of breath.  Endocrine:   Patient denies excessive thirst.  Musculoskeletal:   Patient denies back pain and joint pain.  Neurological:   Patient denies headaches and dizziness.  Psychologic:   Patient denies depression and anxiety.   VITAL SIGNS: None   Complexity of Data:  Records Review:   Previous Patient Records  Urodynamics Review:   Review Urodynamics Tests   01/25/24 01/20/23 03/06/22 03/11/21 03/09/20 01/28/18 01/24/14 07/29/11  PSA  Total PSA 0.78 ng/ml 0.45 ng/ml 0.23 ng/mL 0.28 ng/mL 0.31 ng/mL 0.38 ng/mL 0.43  0.37     PROCEDURES:          Urinalysis Dipstick Dipstick Cont'd  Color: Yellow Bilirubin: Neg mg/dL  Appearance: Clear Ketones: Neg mg/dL  Specific Gravity: 8.974 Blood: Neg ery/uL  pH: 6.0 Protein: Neg mg/dL  Glucose: Neg mg/dL Urobilinogen: 0.2 mg/dL    Nitrites: Neg    Leukocyte Esterase: Neg leu/uL    ASSESSMENT:      ICD-10 Details  1 GU:   BPH w/LUTS - N40.1 Chronic, Stable  2   Urinary Frequency - R35.0 Chronic, Stable     PLAN:           Schedule Return Visit/Planned Activity: Return PRN          Document Letter(s):  Created for Patient: Clinical Summary         Notes:   - The risk, benefits and alternatives of cystoscopy with intravesical Botox  injections (100 units). Was discussed extensively with the patient. Risks include, but  are not limited to, urinary retention, the potential need for self-catheterization or an indwelling catheter, urethral stricture, UTI, hematuria and the inherent risks of general anesthesia. He would like to think about his options and get back to me.

## 2024-07-19 NOTE — Interval H&P Note (Signed)
 History and Physical Interval Note:  07/19/2024 11:14 AM  Jonathan Knox  has presented today for surgery, with the diagnosis of URINARY FREQUENCY AND URGENCY.  The various methods of treatment have been discussed with the patient and family. After consideration of risks, benefits and other options for treatment, the patient has consented to  Procedure(s) with comments: CYSTOSCOPY, WITH INJECTION OF BLADDER NECK OR BLADDER WALL (N/A) - CYSTOSCOPY WITH BOTOX  INJECTIONS (100 UNITS) as a surgical intervention.  The patient's history has been reviewed, patient examined, no change in status, stable for surgery.  I have reviewed the patient's chart and labs.  Questions were answered to the patient's satisfaction.     Lonni Righter Berenice Oehlert

## 2024-07-19 NOTE — Anesthesia Postprocedure Evaluation (Signed)
 Anesthesia Post Note  Patient: Jonathan Knox  Procedure(s) Performed: CYSTOSCOPY, WITH INJECTION OF BLADDER NECK OR BLADDER WALL (Bladder)     Patient location during evaluation: PACU Anesthesia Type: MAC Level of consciousness: awake and alert Pain management: pain level controlled Vital Signs Assessment: post-procedure vital signs reviewed and stable Respiratory status: spontaneous breathing, nonlabored ventilation and respiratory function stable Cardiovascular status: blood pressure returned to baseline and stable Postop Assessment: no apparent nausea or vomiting Anesthetic complications: no   No notable events documented.  Last Vitals:  Vitals:   07/19/24 1230 07/19/24 1245  BP: 91/76 (!) 120/95  Pulse: 87 84  Resp: 11 16  Temp: 36.5 C   SpO2: 96% 99%    Last Pain:  Vitals:   07/19/24 1245  TempSrc:   PainSc: 0-No pain                 Butler Levander Pinal

## 2024-07-19 NOTE — Transfer of Care (Signed)
 Immediate Anesthesia Transfer of Care Note  Patient: Jonathan Knox  Procedure(s) Performed: CYSTOSCOPY, WITH INJECTION OF BLADDER NECK OR BLADDER WALL (Bladder)  Patient Location: PACU  Anesthesia Type:MAC  Level of Consciousness: awake, alert , oriented, and patient cooperative  Airway & Oxygen Therapy: Patient Spontanous Breathing and Patient connected to face mask oxygen  Post-op Assessment: Report given to RN and Post -op Vital signs reviewed and stable  Post vital signs: Reviewed and stable  Last Vitals:  Vitals Value Taken Time  BP 116/80   Temp    Pulse 91 07/19/24 12:07  Resp 14 07/19/24 12:07  SpO2 97 % 07/19/24 12:07  Vitals shown include unfiled device data.  Last Pain:  Vitals:   07/19/24 0905  TempSrc:   PainSc: 2       Patients Stated Pain Goal: 5 (07/19/24 0905)  Complications: No notable events documented.

## 2024-07-19 NOTE — Op Note (Signed)
 Operative Note  Preoperative diagnosis:  1.  Refractory urinary urgency and frequency  Postoperative diagnosis: 1.  Refractory urinary urgency and frequency  Procedure(s): 1.  Cystoscopy with intravesical Botox  injections (100 units)  Surgeon: Lonni Han, MD  Assistants:  None  Anesthesia:  General  Complications:  None  EBL: Less than 5 mL  Specimens: 1.  None  Drains/Catheters: 1.  None  Intraoperative findings:   No intravesical or urethral abnormalities were seen  Indication:  Jonathan Knox is a 58 y.o. male with refractory urinary urgency and frequency despite multiple OAB/BPH medications.  He has been consented for the above procedures, voices understanding and wishes to proceed.  Description of procedure:  After informed consent was obtained, the patient was brought to the operating room and general LMA anesthesia was administered. The patient was then placed in the dorsolithotomy position and prepped and draped in the usual sterile fashion. A timeout was performed. A 23 French rigid cystoscope was then inserted into the urethral meatus and advanced into the bladder under direct vision. A complete bladder survey revealed no intravesical pathology.   I then injected a total of 100 units of Botox  diluted in 10 mL of saline in 0.5 mL aliquots throughout the detrusor musculature, and a grid like fashion.  There was no evidence of bleeding following the injections.  The patient's bladder was then partially drained.  She tolerated the procedure well and was transferred to the postanesthesia in stable condition.  Plan: Discharge home after the patient voids

## 2024-07-20 ENCOUNTER — Encounter (HOSPITAL_COMMUNITY): Payer: Self-pay | Admitting: Urology

## 2024-08-03 ENCOUNTER — Encounter: Payer: Self-pay | Admitting: Family Medicine

## 2024-08-22 ENCOUNTER — Telehealth: Admitting: Physician Assistant

## 2024-08-22 DIAGNOSIS — H1032 Unspecified acute conjunctivitis, left eye: Secondary | ICD-10-CM | POA: Diagnosis not present

## 2024-08-23 MED ORDER — OFLOXACIN 0.3 % OP SOLN: 1.0000 [drp] | Freq: Four times a day (QID) | OPHTHALMIC | 0 refills | Status: AC

## 2024-08-23 NOTE — Progress Notes (Signed)
 I have spent 5 minutes in review of e-visit questionnaire, review and updating patient chart, medical decision making and response to patient.   Elsie Velma Lunger, PA-C

## 2024-08-23 NOTE — Progress Notes (Signed)
E-Visit for Pink Eye   We are sorry that you are not feeling well.  Here is how we plan to help!  Based on what you have shared with me it looks like you have conjunctivitis.  Conjunctivitis is a common inflammatory or infectious condition of the eye that is often referred to as "pink eye".  In most cases it is contagious (viral or bacterial). However, not all conjunctivitis requires antibiotics (ex. Allergic).  We have made appropriate suggestions for you based upon your presentation.  I have prescribed Oflaxacin 1-2 drops 4 times a day times 5 days   Pink eye can be highly contagious.  It is typically spread through direct contact with secretions, or contaminated objects or surfaces that one may have touched.  Strict handwashing is suggested with soap and water is urged.  If not available, use alcohol based had sanitizer.  Avoid unnecessary touching of the eye.  If you wear contact lenses, you will need to refrain from wearing them until you see no white discharge from the eye for at least 24 hours after being on medication.  You should see symptom improvement in 1-2 days after starting the medication regimen.  Call us if symptoms are not improved in 1-2 days.  Home Care: Wash your hands often! Do not wear your contacts until you complete your treatment plan. Avoid sharing towels, bed linen, personal items with a person who has pink eye. See attention for anyone in your home with similar symptoms.  Get Help Right Away If: Your symptoms do not improve. You develop blurred or loss of vision. Your symptoms worsen (increased discharge, pain or redness)   Thank you for choosing an e-visit.  Your e-visit answers were reviewed by a board certified advanced clinical practitioner to complete your personal care plan. Depending upon the condition, your plan could have included both over the counter or prescription medications.  Please review your pharmacy choice. Make sure the pharmacy is open so  you can pick up prescription now. If there is a problem, you may contact your provider through MyChart messaging and have the prescription routed to another pharmacy.  Your safety is important to us. If you have drug allergies check your prescription carefully.   For the next 24 hours you can use MyChart to ask questions about today's visit, request a non-urgent call back, or ask for a work or school excuse. You will get an email in the next two days asking about your experience. I hope that your e-visit has been valuable and will speed your recovery.  

## 2025-01-26 ENCOUNTER — Encounter: Admitting: Family Medicine
